# Patient Record
Sex: Male | Born: 1940 | Race: White | Hispanic: No | Smoking: Former smoker
Health system: Southern US, Community
[De-identification: ages and names within clinical notes are randomized; demographics above are authoritative.]

## PROBLEM LIST (undated history)

## (undated) DIAGNOSIS — J45909 Unspecified asthma, uncomplicated: Secondary | ICD-10-CM

## (undated) DIAGNOSIS — M1990 Unspecified osteoarthritis, unspecified site: Secondary | ICD-10-CM

## (undated) DIAGNOSIS — R188 Other ascites: Secondary | ICD-10-CM

## (undated) DIAGNOSIS — I639 Cerebral infarction, unspecified: Secondary | ICD-10-CM

## (undated) DIAGNOSIS — E119 Type 2 diabetes mellitus without complications: Secondary | ICD-10-CM

## (undated) DIAGNOSIS — I10 Essential (primary) hypertension: Secondary | ICD-10-CM

## (undated) DIAGNOSIS — K7460 Unspecified cirrhosis of liver: Secondary | ICD-10-CM

## (undated) DIAGNOSIS — I509 Heart failure, unspecified: Secondary | ICD-10-CM

## (undated) DIAGNOSIS — R569 Unspecified convulsions: Secondary | ICD-10-CM

## (undated) DIAGNOSIS — C801 Malignant (primary) neoplasm, unspecified: Secondary | ICD-10-CM

## (undated) DIAGNOSIS — J449 Chronic obstructive pulmonary disease, unspecified: Secondary | ICD-10-CM

## (undated) DIAGNOSIS — N289 Disorder of kidney and ureter, unspecified: Secondary | ICD-10-CM

## (undated) DIAGNOSIS — I219 Acute myocardial infarction, unspecified: Secondary | ICD-10-CM

## (undated) DIAGNOSIS — I2510 Atherosclerotic heart disease of native coronary artery without angina pectoris: Secondary | ICD-10-CM

## (undated) HISTORY — PX: HERNIA REPAIR: SHX51

## (undated) HISTORY — PX: HEMORRHOID SURGERY: SHX153

## (undated) HISTORY — PX: CORONARY STENT PLACEMENT: SHX1402

---

## 2002-10-31 ENCOUNTER — Emergency Department (HOSPITAL_COMMUNITY): Admission: EM | Admit: 2002-10-31 | Discharge: 2002-10-31 | Payer: Self-pay | Admitting: Emergency Medicine

## 2003-03-31 ENCOUNTER — Emergency Department (HOSPITAL_COMMUNITY): Admission: EM | Admit: 2003-03-31 | Discharge: 2003-03-31 | Payer: Self-pay | Admitting: Emergency Medicine

## 2010-08-14 ENCOUNTER — Emergency Department (HOSPITAL_COMMUNITY)
Admission: EM | Admit: 2010-08-14 | Discharge: 2010-08-14 | Disposition: A | Discharge status: Home / Self Care | Payer: Medicare Other | Attending: Emergency Medicine | Admitting: Emergency Medicine

## 2010-08-14 DIAGNOSIS — F1010 Alcohol abuse, uncomplicated: Secondary | ICD-10-CM | POA: Insufficient documentation

## 2012-08-12 ENCOUNTER — Emergency Department: Payer: Self-pay | Admitting: Unknown Physician Specialty

## 2012-08-12 LAB — CBC
MCHC: 35.3 g/dL (ref 32.0–36.0)
MCV: 87 fL (ref 80–100)
Platelet: 182 10*3/uL (ref 150–440)
RBC: 4.46 10*6/uL (ref 4.40–5.90)
RDW: 14.2 % (ref 11.5–14.5)

## 2012-08-12 LAB — COMPREHENSIVE METABOLIC PANEL
Albumin: 3.9 g/dL (ref 3.4–5.0)
Alkaline Phosphatase: 64 U/L (ref 50–136)
Anion Gap: 8 (ref 7–16)
Bilirubin,Total: 0.3 mg/dL (ref 0.2–1.0)
Co2: 28 mmol/L (ref 21–32)
Creatinine: 0.64 mg/dL (ref 0.60–1.30)
EGFR (African American): >60
EGFR (Non-African Amer.): >60
Glucose: 122 mg/dL — ABNORMAL HIGH (ref 65–99)
Osmolality: 279 (ref 275–301)
Potassium: 3.4 mmol/L — ABNORMAL LOW (ref 3.5–5.1)

## 2012-08-12 LAB — ETHANOL
Ethanol %: 0.221 % — ABNORMAL HIGH (ref 0.000–0.080)
Ethanol: 221 mg/dL

## 2012-08-12 LAB — TSH: Thyroid Stimulating Horm: 0.898 u[IU]/mL

## 2012-08-13 LAB — URINALYSIS, COMPLETE
Blood: NEGATIVE
Glucose,UR: NEGATIVE mg/dL (ref 0–75)
Hyaline Cast: 4
Ketone: NEGATIVE
Leukocyte Esterase: NEGATIVE
Nitrite: NEGATIVE
Ph: 5 (ref 4.5–8.0)
Squamous Epithelial: NONE SEEN
WBC UR: 1 /HPF (ref 0–5)

## 2012-08-13 LAB — DRUG SCREEN, URINE
Amphetamines, Ur Screen: NEGATIVE (ref ?–1000)
Barbiturates, Ur Screen: POSITIVE (ref ?–200)
Cannabinoid 50 Ng, Ur ~~LOC~~: NEGATIVE (ref ?–50)
Methadone, Ur Screen: NEGATIVE (ref ?–300)
Phencyclidine (PCP) Ur S: NEGATIVE (ref ?–25)
Tricyclic, Ur Screen: NEGATIVE (ref ?–1000)

## 2014-05-14 ENCOUNTER — Inpatient Hospital Stay (HOSPITAL_COMMUNITY)
Admission: EM | Admit: 2014-05-14 | Discharge: 2014-05-21 | DRG: 313 | Disposition: A | Discharge status: Home / Self Care | Payer: Medicare Other | Attending: Internal Medicine | Admitting: Internal Medicine

## 2014-05-14 ENCOUNTER — Emergency Department (HOSPITAL_COMMUNITY): Payer: Medicare Other

## 2014-05-14 ENCOUNTER — Encounter (HOSPITAL_COMMUNITY): Payer: Self-pay | Admitting: Emergency Medicine

## 2014-05-14 DIAGNOSIS — E785 Hyperlipidemia, unspecified: Secondary | ICD-10-CM | POA: Diagnosis present

## 2014-05-14 DIAGNOSIS — F1910 Other psychoactive substance abuse, uncomplicated: Secondary | ICD-10-CM | POA: Diagnosis present

## 2014-05-14 DIAGNOSIS — Y905 Blood alcohol level of 100-119 mg/100 ml: Secondary | ICD-10-CM | POA: Diagnosis not present

## 2014-05-14 DIAGNOSIS — M1990 Unspecified osteoarthritis, unspecified site: Secondary | ICD-10-CM | POA: Diagnosis present

## 2014-05-14 DIAGNOSIS — K7290 Hepatic failure, unspecified without coma: Secondary | ICD-10-CM

## 2014-05-14 DIAGNOSIS — F1020 Alcohol dependence, uncomplicated: Secondary | ICD-10-CM | POA: Diagnosis not present

## 2014-05-14 DIAGNOSIS — D649 Anemia, unspecified: Secondary | ICD-10-CM | POA: Diagnosis not present

## 2014-05-14 DIAGNOSIS — J45909 Unspecified asthma, uncomplicated: Secondary | ICD-10-CM | POA: Diagnosis not present

## 2014-05-14 DIAGNOSIS — Z884 Allergy status to anesthetic agent status: Secondary | ICD-10-CM

## 2014-05-14 DIAGNOSIS — I252 Old myocardial infarction: Secondary | ICD-10-CM | POA: Diagnosis not present

## 2014-05-14 DIAGNOSIS — I509 Heart failure, unspecified: Secondary | ICD-10-CM | POA: Diagnosis present

## 2014-05-14 DIAGNOSIS — R0602 Shortness of breath: Secondary | ICD-10-CM

## 2014-05-14 DIAGNOSIS — G4700 Insomnia, unspecified: Secondary | ICD-10-CM | POA: Diagnosis present

## 2014-05-14 DIAGNOSIS — I493 Ventricular premature depolarization: Secondary | ICD-10-CM | POA: Diagnosis not present

## 2014-05-14 DIAGNOSIS — I10 Essential (primary) hypertension: Secondary | ICD-10-CM | POA: Diagnosis not present

## 2014-05-14 DIAGNOSIS — Z9981 Dependence on supplemental oxygen: Secondary | ICD-10-CM

## 2014-05-14 DIAGNOSIS — E876 Hypokalemia: Secondary | ICD-10-CM

## 2014-05-14 DIAGNOSIS — F17210 Nicotine dependence, cigarettes, uncomplicated: Secondary | ICD-10-CM | POA: Diagnosis present

## 2014-05-14 DIAGNOSIS — R109 Unspecified abdominal pain: Secondary | ICD-10-CM

## 2014-05-14 DIAGNOSIS — M79605 Pain in left leg: Secondary | ICD-10-CM | POA: Diagnosis present

## 2014-05-14 DIAGNOSIS — E119 Type 2 diabetes mellitus without complications: Secondary | ICD-10-CM

## 2014-05-14 DIAGNOSIS — Z888 Allergy status to other drugs, medicaments and biological substances status: Secondary | ICD-10-CM | POA: Diagnosis not present

## 2014-05-14 DIAGNOSIS — J441 Chronic obstructive pulmonary disease with (acute) exacerbation: Secondary | ICD-10-CM | POA: Diagnosis not present

## 2014-05-14 DIAGNOSIS — K5900 Constipation, unspecified: Secondary | ICD-10-CM | POA: Diagnosis present

## 2014-05-14 DIAGNOSIS — K7460 Unspecified cirrhosis of liver: Secondary | ICD-10-CM | POA: Diagnosis not present

## 2014-05-14 DIAGNOSIS — F419 Anxiety disorder, unspecified: Secondary | ICD-10-CM | POA: Diagnosis present

## 2014-05-14 DIAGNOSIS — R0789 Other chest pain: Secondary | ICD-10-CM | POA: Diagnosis not present

## 2014-05-14 DIAGNOSIS — F333 Major depressive disorder, recurrent, severe with psychotic symptoms: Secondary | ICD-10-CM | POA: Diagnosis present

## 2014-05-14 DIAGNOSIS — Z590 Homelessness: Secondary | ICD-10-CM

## 2014-05-14 DIAGNOSIS — Z8673 Personal history of transient ischemic attack (TIA), and cerebral infarction without residual deficits: Secondary | ICD-10-CM

## 2014-05-14 DIAGNOSIS — R079 Chest pain, unspecified: Secondary | ICD-10-CM | POA: Diagnosis present

## 2014-05-14 DIAGNOSIS — R0902 Hypoxemia: Secondary | ICD-10-CM | POA: Diagnosis not present

## 2014-05-14 DIAGNOSIS — N289 Disorder of kidney and ureter, unspecified: Secondary | ICD-10-CM | POA: Diagnosis present

## 2014-05-14 DIAGNOSIS — I2510 Atherosclerotic heart disease of native coronary artery without angina pectoris: Secondary | ICD-10-CM | POA: Diagnosis present

## 2014-05-14 DIAGNOSIS — R45851 Suicidal ideations: Secondary | ICD-10-CM | POA: Insufficient documentation

## 2014-05-14 DIAGNOSIS — M545 Low back pain: Secondary | ICD-10-CM | POA: Diagnosis present

## 2014-05-14 DIAGNOSIS — K7682 Hepatic encephalopathy: Secondary | ICD-10-CM

## 2014-05-14 DIAGNOSIS — Z8546 Personal history of malignant neoplasm of prostate: Secondary | ICD-10-CM | POA: Diagnosis not present

## 2014-05-14 DIAGNOSIS — R140 Abdominal distension (gaseous): Secondary | ICD-10-CM

## 2014-05-14 DIAGNOSIS — R072 Precordial pain: Secondary | ICD-10-CM

## 2014-05-14 HISTORY — DX: Disorder of kidney and ureter, unspecified: N28.9

## 2014-05-14 HISTORY — DX: Unspecified osteoarthritis, unspecified site: M19.90

## 2014-05-14 HISTORY — DX: Heart failure, unspecified: I50.9

## 2014-05-14 HISTORY — DX: Essential (primary) hypertension: I10

## 2014-05-14 HISTORY — DX: Unspecified asthma, uncomplicated: J45.909

## 2014-05-14 HISTORY — DX: Chronic obstructive pulmonary disease, unspecified: J44.9

## 2014-05-14 HISTORY — DX: Atherosclerotic heart disease of native coronary artery without angina pectoris: I25.10

## 2014-05-14 HISTORY — DX: Malignant (primary) neoplasm, unspecified: C80.1

## 2014-05-14 HISTORY — DX: Type 2 diabetes mellitus without complications: E11.9

## 2014-05-14 HISTORY — DX: Cerebral infarction, unspecified: I63.9

## 2014-05-14 HISTORY — DX: Unspecified cirrhosis of liver: K74.60

## 2014-05-14 HISTORY — DX: Unspecified convulsions: R56.9

## 2014-05-14 LAB — CBC WITH DIFFERENTIAL/PLATELET
Basophils Absolute: 0 10*3/uL (ref 0.0–0.1)
Basophils Relative: 0 % (ref 0–1)
EOS ABS: 0.1 10*3/uL (ref 0.0–0.7)
Eosinophils Relative: 2 % (ref 0–5)
HCT: 32.5 % — ABNORMAL LOW (ref 39.0–52.0)
HEMOGLOBIN: 10.8 g/dL — AB (ref 13.0–17.0)
Lymphocytes Relative: 12 % (ref 12–46)
Lymphs Abs: 0.7 10*3/uL (ref 0.7–4.0)
MCH: 27.8 pg (ref 26.0–34.0)
MCHC: 33.2 g/dL (ref 30.0–36.0)
MCV: 83.5 fL (ref 78.0–100.0)
MONOS PCT: 5 % (ref 3–12)
Monocytes Absolute: 0.3 10*3/uL (ref 0.1–1.0)
Neutro Abs: 5 10*3/uL (ref 1.7–7.7)
Neutrophils Relative %: 81 % — ABNORMAL HIGH (ref 43–77)
Platelets: 158 10*3/uL (ref 150–400)
RBC: 3.89 MIL/uL — AB (ref 4.22–5.81)
RDW: 14.4 % (ref 11.5–15.5)
WBC: 6.1 10*3/uL (ref 4.0–10.5)

## 2014-05-14 LAB — RAPID URINE DRUG SCREEN, HOSP PERFORMED
Amphetamines: NOT DETECTED
BARBITURATES: NOT DETECTED
Benzodiazepines: POSITIVE — AB
COCAINE: NOT DETECTED
OPIATES: NOT DETECTED
TETRAHYDROCANNABINOL: NOT DETECTED

## 2014-05-14 LAB — URINALYSIS, ROUTINE W REFLEX MICROSCOPIC
Bilirubin Urine: NEGATIVE
Glucose, UA: 100 mg/dL — AB
Ketones, ur: NEGATIVE mg/dL
LEUKOCYTES UA: NEGATIVE
Nitrite: NEGATIVE
PH: 6 (ref 5.0–8.0)
PROTEIN: NEGATIVE mg/dL
Specific Gravity, Urine: 1.009 (ref 1.005–1.030)
Urobilinogen, UA: 1 mg/dL (ref 0.0–1.0)

## 2014-05-14 LAB — COMPREHENSIVE METABOLIC PANEL
ALK PHOS: 84 U/L (ref 39–117)
ALT: 27 U/L (ref 0–53)
ANION GAP: 13 (ref 5–15)
AST: 26 U/L (ref 0–37)
Albumin: 3 g/dL — ABNORMAL LOW (ref 3.5–5.2)
BUN: <5 mg/dL — ABNORMAL LOW (ref 6–23)
CO2: 24 mmol/L (ref 19–32)
CREATININE: 0.71 mg/dL (ref 0.50–1.35)
Calcium: 7.5 mg/dL — ABNORMAL LOW (ref 8.4–10.5)
Chloride: 98 mmol/L (ref 96–112)
Glucose, Bld: 184 mg/dL — ABNORMAL HIGH (ref 70–99)
Potassium: 2.7 mmol/L — CL (ref 3.5–5.1)
Sodium: 135 mmol/L (ref 135–145)
TOTAL PROTEIN: 5.4 g/dL — AB (ref 6.0–8.3)
Total Bilirubin: 0.3 mg/dL (ref 0.3–1.2)

## 2014-05-14 LAB — URINE MICROSCOPIC-ADD ON

## 2014-05-14 LAB — PROTIME-INR
INR: 1.07 (ref 0.00–1.49)
Prothrombin Time: 14.1 seconds (ref 11.6–15.2)

## 2014-05-14 LAB — AMMONIA: Ammonia: 68 umol/L — ABNORMAL HIGH (ref 11–32)

## 2014-05-14 LAB — I-STAT TROPONIN, ED: TROPONIN I, POC: 0 ng/mL (ref 0.00–0.08)

## 2014-05-14 LAB — MRSA PCR SCREENING: MRSA BY PCR: NEGATIVE

## 2014-05-14 LAB — ETHANOL: ALCOHOL ETHYL (B): 112 mg/dL — AB (ref 0–9)

## 2014-05-14 LAB — ACETAMINOPHEN LEVEL

## 2014-05-14 LAB — SALICYLATE LEVEL: Salicylate Lvl: <4 mg/dL (ref 2.8–20.0)

## 2014-05-14 MED ORDER — LORAZEPAM 2 MG/ML IJ SOLN
1.0000 mg | Freq: Four times a day (QID) | INTRAMUSCULAR | Status: AC | PRN
  Administered 2014-05-14 – 2014-05-16 (×4): 1 mg via INTRAVENOUS
  Filled 2014-05-14 (×5): qty 1

## 2014-05-14 MED ORDER — GI COCKTAIL ~~LOC~~
30.0000 mL | Freq: Once | ORAL | Status: AC
  Administered 2014-05-14: 30 mL via ORAL
  Filled 2014-05-14: qty 30

## 2014-05-14 MED ORDER — LACTULOSE 10 GM/15ML PO SOLN
10.0000 g | Freq: Once | ORAL | Status: AC
  Administered 2014-05-14: 10 g via ORAL
  Filled 2014-05-14: qty 15

## 2014-05-14 MED ORDER — ENOXAPARIN SODIUM 40 MG/0.4ML ~~LOC~~ SOLN
40.0000 mg | SUBCUTANEOUS | Status: DC
  Administered 2014-05-14: 40 mg via SUBCUTANEOUS
  Filled 2014-05-14 (×6): qty 0.4

## 2014-05-14 MED ORDER — MORPHINE SULFATE 4 MG/ML IJ SOLN
4.0000 mg | Freq: Once | INTRAMUSCULAR | Status: AC
Start: 2014-05-14 — End: 2014-05-14
  Administered 2014-05-14: 4 mg via INTRAVENOUS
  Filled 2014-05-14: qty 1

## 2014-05-14 MED ORDER — POTASSIUM CHLORIDE CRYS ER 20 MEQ PO TBCR
40.0000 meq | EXTENDED_RELEASE_TABLET | Freq: Once | ORAL | Status: AC
  Administered 2014-05-14: 40 meq via ORAL
  Filled 2014-05-14: qty 2

## 2014-05-14 MED ORDER — KETOROLAC TROMETHAMINE 30 MG/ML IJ SOLN
30.0000 mg | Freq: Once | INTRAMUSCULAR | Status: AC
  Administered 2014-05-14: 30 mg via INTRAVENOUS
  Filled 2014-05-14: qty 1

## 2014-05-14 MED ORDER — IOHEXOL 350 MG/ML SOLN
100.0000 mL | Freq: Once | INTRAVENOUS | Status: AC | PRN
  Administered 2014-05-14: 100 mL via INTRAVENOUS

## 2014-05-14 MED ORDER — SODIUM CHLORIDE 0.9 % IV SOLN
INTRAVENOUS | Status: DC
  Administered 2014-05-14 – 2014-05-15 (×2): via INTRAVENOUS

## 2014-05-14 MED ORDER — IPRATROPIUM-ALBUTEROL 0.5-2.5 (3) MG/3ML IN SOLN: 3.0000 mL | Freq: Four times a day (QID) | RESPIRATORY_TRACT | Status: DC | PRN

## 2014-05-14 MED ORDER — IOHEXOL 300 MG/ML  SOLN: 20.0000 mL | INTRAMUSCULAR | Status: DC

## 2014-05-14 MED ORDER — SODIUM CHLORIDE 0.9 % IV SOLN
Freq: Once | INTRAVENOUS | Status: AC
  Administered 2014-05-14: 14:00:00 via INTRAVENOUS

## 2014-05-14 MED ORDER — LEVOFLOXACIN 750 MG PO TABS
750.0000 mg | ORAL_TABLET | Freq: Every day | ORAL | Status: DC
  Administered 2014-05-14 – 2014-05-18 (×5): 750 mg via ORAL
  Filled 2014-05-14 (×6): qty 1

## 2014-05-14 MED ORDER — SODIUM CHLORIDE 0.9 % IV BOLUS (SEPSIS)
1000.0000 mL | Freq: Once | INTRAVENOUS | Status: AC
  Administered 2014-05-14: 1000 mL via INTRAVENOUS

## 2014-05-14 MED ORDER — THIAMINE HCL 100 MG/ML IJ SOLN
100.0000 mg | Freq: Every day | INTRAMUSCULAR | Status: DC
  Filled 2014-05-14 (×8): qty 1

## 2014-05-14 MED ORDER — LACTULOSE 10 GM/15ML PO SOLN
10.0000 g | Freq: Every day | ORAL | Status: DC
  Administered 2014-05-14 – 2014-05-20 (×6): 10 g via ORAL
  Filled 2014-05-14 (×8): qty 15

## 2014-05-14 MED ORDER — FOLIC ACID 1 MG PO TABS
1.0000 mg | ORAL_TABLET | Freq: Every day | ORAL | Status: DC
  Administered 2014-05-14 – 2014-05-21 (×8): 1 mg via ORAL
  Filled 2014-05-14 (×8): qty 1

## 2014-05-14 MED ORDER — IPRATROPIUM-ALBUTEROL 0.5-2.5 (3) MG/3ML IN SOLN
3.0000 mL | Freq: Once | RESPIRATORY_TRACT | Status: AC
  Administered 2014-05-14: 3 mL via RESPIRATORY_TRACT
  Filled 2014-05-14: qty 3

## 2014-05-14 MED ORDER — POTASSIUM CHLORIDE 10 MEQ/100ML IV SOLN
10.0000 meq | INTRAVENOUS | Status: DC
  Administered 2014-05-14 (×3): 10 meq via INTRAVENOUS
  Filled 2014-05-14 (×3): qty 100

## 2014-05-14 MED ORDER — VITAMIN B-1 100 MG PO TABS
100.0000 mg | ORAL_TABLET | Freq: Every day | ORAL | Status: DC
  Administered 2014-05-14 – 2014-05-21 (×8): 100 mg via ORAL
  Filled 2014-05-14 (×8): qty 1

## 2014-05-14 MED ORDER — LORAZEPAM 1 MG PO TABS
1.0000 mg | ORAL_TABLET | Freq: Four times a day (QID) | ORAL | Status: AC | PRN
  Filled 2014-05-14: qty 1

## 2014-05-14 MED ORDER — SODIUM CHLORIDE 0.9 % IJ SOLN
3.0000 mL | Freq: Two times a day (BID) | INTRAMUSCULAR | Status: DC
  Administered 2014-05-14 – 2014-05-20 (×11): 3 mL via INTRAVENOUS

## 2014-05-14 MED ORDER — PANTOPRAZOLE SODIUM 40 MG IV SOLR
40.0000 mg | Freq: Two times a day (BID) | INTRAVENOUS | Status: DC
  Administered 2014-05-14 – 2014-05-17 (×7): 40 mg via INTRAVENOUS
  Filled 2014-05-14 (×12): qty 40

## 2014-05-14 MED ORDER — PREDNISONE 20 MG PO TABS
40.0000 mg | ORAL_TABLET | Freq: Every day | ORAL | Status: DC
  Administered 2014-05-14 – 2014-05-19 (×5): 40 mg via ORAL
  Filled 2014-05-14 (×6): qty 2

## 2014-05-14 MED ORDER — ADULT MULTIVITAMIN W/MINERALS CH
1.0000 | ORAL_TABLET | Freq: Every day | ORAL | Status: DC
  Administered 2014-05-14 – 2014-05-21 (×8): 1 via ORAL
  Filled 2014-05-14 (×9): qty 1

## 2014-05-14 MED ORDER — IPRATROPIUM-ALBUTEROL 0.5-2.5 (3) MG/3ML IN SOLN
3.0000 mL | Freq: Four times a day (QID) | RESPIRATORY_TRACT | Status: DC
  Administered 2014-05-14: 3 mL via RESPIRATORY_TRACT
  Filled 2014-05-14: qty 3

## 2014-05-14 MED ORDER — ONDANSETRON HCL 4 MG/2ML IJ SOLN
4.0000 mg | Freq: Once | INTRAMUSCULAR | Status: AC
  Administered 2014-05-14: 4 mg via INTRAVENOUS
  Filled 2014-05-14: qty 2

## 2014-05-14 MED ORDER — BUDESONIDE-FORMOTEROL FUMARATE 160-4.5 MCG/ACT IN AERO
2.0000 | INHALATION_SPRAY | Freq: Two times a day (BID) | RESPIRATORY_TRACT | Status: DC
  Administered 2014-05-17 – 2014-05-21 (×9): 2 via RESPIRATORY_TRACT
  Filled 2014-05-14: qty 6

## 2014-05-14 MED ORDER — ONDANSETRON HCL 4 MG/2ML IJ SOLN: 4.0000 mg | Freq: Three times a day (TID) | INTRAMUSCULAR | Status: AC | PRN

## 2014-05-14 NOTE — ED Notes (Signed)
Homeless. EMS picked up from a motel where he stayed last night. Reports chest pain. Has been stating since EMS arrival that he wants to die and could care less if he dies right now. States this numerous times. Very defensive answering all questions. Deep rattling cough heard, states he has had that for years. Edema of legs, hands, and large ascites in abdomen. States it is increased. States the CP is substernal and started 3 hours ago. Refused asprin in route, states he is allergic to it. Reports when he had a place to stay, he used home 02, 5L [place]. Currently 90% on room air.

## 2014-05-14 NOTE — ED Notes (Signed)
Admitting team MD at bedside.

## 2014-05-14 NOTE — ED Notes (Signed)
Attempted report 

## 2014-05-14 NOTE — ED Notes (Signed)
Notified CT patient finished with PO contrast.

## 2014-05-14 NOTE — ED Notes (Signed)
Mitchell Ratel, MD of critical K 2.7.

## 2014-05-14 NOTE — ED Notes (Signed)
Patient transported to X-ray 

## 2014-05-14 NOTE — H&P (Signed)
Date: 05/14/2014               Patient Name:  Mitchell Molina MRN: [id number]  DOB: [birth year] Age / Sex: 74 y.o., male   PCP: No primary care provider on file.         Medical Service: Internal Medicine Teaching Service         Attending Physician: Dr. Axel Filler, MD    First Contact: Dr. Charlott Rakes Pager: [phone]  Second Contact: Dr. Bing Neighbors Pager: [phone]       After Hours (After 5p/  First Contact Pager: [phone]  weekends / holidays): Second Contact Pager: [phone]   Chief Complaint: chest pain, worsening cough  History of Present Illness: Mitchell Molina is a 74 year old male with no prior medical history who presents to the ED with chest pain. At the time of interview, his blood alcohol level was 112.  This morning around 8-9AM, he reports drinking beer with his friends at a motel when we first noted the onset of intermittent chest pain. The pain started at the R lower chest and extended up to the mid-sternal area. He reports the pain improves with lying supine but worsens with deep inspiration and coughing. He also reports having two heart attacks about 5-6 years ago in [place], [place] which improved with the medications he received in the hospital.   He also reports a cough productive with "brown stuff " that has been ongoing for years though has worsened over the last several days. He has been on home oxygen of 2-3L in the past though not recently and was told by a doctor that he had bronchitis/emphysema. He has a 30-pack year smoking history and reports that a 12-pack of beer lasts him a couple days and has so for the last 15-20 years. He has considered the need to cut down on drinking, felt guilty about drinking, and required a drink in the morning to get his day going. Of note, he expresses that he has thoughts of wanting to hurt himself and others and would do so if he had access to a firearm though currently does not. He denies other illicit and IV drug use.   In the  ED, he was found to have K 2.7 and NH4 68. CT chest/abdomen was unremarkable for an acute process, and he was given Duonebs and lactulose.    Meds: Current Facility-Administered Medications  Medication Dose Route Frequency Provider Last Rate Last Dose  . lactulose (CHRONULAC) 10 GM/15ML solution 10 g  10 g Oral Once Wandra Arthurs, MD       No current outpatient prescriptions on file.    Allergies: Allergies as of 05/14/2014 - Review Complete 05/14/2014  Allergen Reaction Noted  . Asa [aspirin] Other (See Comments) 05/14/2014  . Tylenol [acetaminophen] Other (See Comments) 05/14/2014   Past Medical History  Diagnosis Date  . COPD (chronic obstructive pulmonary disease)   . Diabetes mellitus without complication   . Hypertension   . Coronary artery disease   . Arthritis   . Asthma   . Cancer   . CHF (congestive heart failure)   . Seizures   . Stroke   . Renal disorder   . Cirrhosis    Past Surgical History  Procedure Laterality Date  . Hernia repair    . Hemorrhoid surgery     History reviewed. No pertinent family history. History   Social History  . Marital Status: Legally Separated    Spouse  Name: N/A    Number of Children: N/A  . Years of Education: N/A   Occupational History  . Not on file.   Social History Main Topics  . Smoking status: Current Some Day Smoker  . Smokeless tobacco: Not on file  . Alcohol Use: Yes     Comment: daily; liquor or wine; last drink yesterday  . Drug Use: No  . Sexual Activity: Not on file   Other Topics Concern  . Not on file   Social History Narrative  . No narrative on file    Review of Systems: Review of Systems  Constitutional: Positive for fever and malaise/fatigue. Negative for chills.  Respiratory: Positive for cough, sputum production and shortness of breath.   Cardiovascular: Positive for chest pain and leg swelling.  Gastrointestinal: Positive for nausea, vomiting, diarrhea, constipation and blood in stool.        Intermittent blood in his stool  Genitourinary: Positive for dysuria. Negative for hematuria.       Intermittent  Musculoskeletal: Negative for falls.  Neurological: Positive for weakness and headaches.  Psychiatric/Behavioral: Positive for depression, suicidal ideas, hallucinations and substance abuse. The patient is nervous/anxious.      Physical Exam: Blood pressure 135/77, pulse 94, temperature 98.6 F (37 C), temperature source Oral, resp. rate 20, height 5\' 10"  (1.778 m), weight 200 lb (90.719 kg), SpO2 97 %.  General: resting in bed, NAD, 2L O2 by [place] HEENT: PERRL, EOMI, no scleral icterus, dry mucous membranes, oropharynx clear Cardiac: RRR, no rubs, murmurs or gallops, pain with palpation over the mid-sternum Pulm: wheezing present in all fields Abd: soft, distended with mild tenderness all over on palpation, BS present Ext: warm and well perfused, no pedal edema Neuro: L forehead with different sensation compared to R, rest of the exam deferred as he was not agreeable to it   Lab results: Basic Metabolic Panel:  Recent Labs  05/14/14 1142  NA 135  K 2.7*  CL 98  CO2 24  GLUCOSE 184*  BUN <5*  CREATININE 0.71  CALCIUM 7.5*   Liver Function Tests:  Recent Labs  05/14/14 1142  AST 26  ALT 27  ALKPHOS 84  BILITOT 0.3  PROT 5.4*  ALBUMIN 3.0*    Recent Labs  05/14/14 1142  AMMONIA 68*   CBC:  Recent Labs  05/14/14 1142  WBC 6.1  NEUTROABS 5.0  HGB 10.8*  HCT 32.5*  MCV 83.5  PLT 158   Coagulation:  Recent Labs  05/14/14 1142  LABPROT 14.1  INR 1.07   Urine Drug Screen: Drugs of Abuse     Component Value Date/Time   LABOPIA NONE DETECTED 05/14/2014 1206   COCAINSCRNUR NONE DETECTED 05/14/2014 1206   LABBENZ POSITIVE* 05/14/2014 1206   AMPHETMU NONE DETECTED 05/14/2014 1206   THCU NONE DETECTED 05/14/2014 1206   LABBARB NONE DETECTED 05/14/2014 1206    Alcohol Level:  Recent Labs  05/14/14 1142  ETH 112*    Urinalysis:  Recent Labs  05/14/14 1206  COLORURINE YELLOW  LABSPEC 1.009  PHURINE 6.0  GLUCOSEU 100*  HGBUR SMALL*  BILIRUBINUR NEGATIVE  KETONESUR NEGATIVE  PROTEINUR NEGATIVE  UROBILINOGEN 1.0  NITRITE NEGATIVE  LEUKOCYTESUR NEGATIVE    Imaging results:  Ct Abdomen Pelvis Wo Contrast  05/14/2014   CLINICAL DATA:  Chest and mid abdominal pain. History of multiple medical problems including chronic obstructive pulmonary disease, hypertension, diabetes and coronary artery disease. Concern for pulmonary embolism.  EXAM: CT CHEST, ABDOMEN AND PELVIS  WITHOUT CONTRAST  TECHNIQUE: Multidetector CT imaging of the chest, abdomen and pelvis was performed following the standard protocol without IV contrast.  COMPARISON:  None.  FINDINGS: CT CHEST FINDINGS  Study was ordered and initially attempted as a CTA. However, there was contrast extravasation from the IV in the right upper arm. 76 ml of Omnipaque 350 was infiltrated into the right upper arm soft tissues. I examined the patient and demonstrated no skin or pulse changes. Cold compresses were applied. A follow-up scout image of the right upper arm demonstrates the contrast dispersed throughout the medial soft tissues of the right upper arm. Contrast extravasation orders will be placed in the patient's chart. Noncontrast examinations of the chest, abdomen and pelvis for subsequently requested.  Mediastinum: There are no enlarged mediastinal or hilar lymph nodes. The thyroid gland and trachea demonstrate no significant findings. There is a small hiatal hernia. The heart size is normal. There is trace pericardial fluid versus thickening.Coronary artery calcifications are noted. There is mild atherosclerosis of the aorta and great vessels.  Lungs/Pleura: There is no pleural effusion.The lungs demonstrate moderate centrilobular and paraseptal emphysema. There is an irregular density within the lingula on axial images 30 through 40. This has a linear  configuration on the reformatted images and is most consistent with scarring. There is no confluent airspace opacity or suspicious pulmonary nodule.  Musculoskeletal/Chest wall: Bilateral gynecomastia noted. There are no worrisome osseous findings. There are bone islands and old rib fractures on the left.  CT ABDOMEN AND PELVIS FINDINGS  Hepatobiliary: As evaluated in the noncontrast state, the liver demonstrates no focal abnormality. No evidence of gallstones, gallbladder wall thickening or biliary dilatation.  Pancreas: Unremarkable. No pancreatic ductal dilatation or surrounding inflammatory changes.  Spleen: Normal in size without focal abnormality.  Adrenals/Urinary Tract: Both adrenal glands appear normal.There is a small amount of contrast material within the renal collecting systems bilaterally. There is no evidence of urinary tract calculus or hydronephrosis. There is no evidence of renal mass. A small amount of air is present within the urinary bladder which otherwise appears unremarkable.  Stomach/Bowel: No evidence of bowel wall thickening, distention or surrounding inflammatory change.The appendix appears normal. There are diverticular changes of the sigmoid colon. No extraluminal fluid collection or ascites demonstrated.  Vascular/Lymphatic: There are no enlarged abdominal or pelvic lymph nodes. Mild aortoiliac atherosclerosis noted.  Reproductive: The prostate gland and seminal vesicles demonstrate no significant findings.  Other: No evidence of abdominal wall mass or hernia.  Musculoskeletal: No acute or significant osseous findings.  IMPRESSION: 1. No acute findings demonstrated within the chest, abdomen or pelvis on noncontrast imaging. 2. Attempted contrast-enhanced study aborted secondary to contrast extravasation into the right upper arm. Post extravasation orders are to be placed in the patient's chart. Elevation of the right arm, cold compresses and close clinical observation recommended. 3.  Probable scarring in the lingula. Although not morphologically very concerning for malignancy, given presumed risk factors for lung cancer, chest CT follow-up in 6 months suggested. 4. Atherosclerosis as described.   Electronically Signed   By: Camie Patience M.D.   On: 05/14/2014 15:58   Dg Chest 2 View  05/14/2014   CLINICAL DATA:  Cough and chest pain  EXAM: CHEST  2 VIEW  COMPARISON:  None.  FINDINGS: Cardiac shadow is within normal limits. The lungs are clear bilaterally. Old rib fractures with healing are noted on the right. No acute abnormality is seen.  IMPRESSION: No active cardiopulmonary disease.   Electronically Signed  By: Inez Catalina M.D.   On: 05/14/2014 12:24   Ct Chest Wo Contrast  05/14/2014   CLINICAL DATA:  Chest and mid abdominal pain. History of multiple medical problems including chronic obstructive pulmonary disease, hypertension, diabetes and coronary artery disease. Concern for pulmonary embolism.  EXAM: CT CHEST, ABDOMEN AND PELVIS WITHOUT CONTRAST  TECHNIQUE: Multidetector CT imaging of the chest, abdomen and pelvis was performed following the standard protocol without IV contrast.  COMPARISON:  None.  FINDINGS: CT CHEST FINDINGS  Study was ordered and initially attempted as a CTA. However, there was contrast extravasation from the IV in the right upper arm. 76 ml of Omnipaque 350 was infiltrated into the right upper arm soft tissues. I examined the patient and demonstrated no skin or pulse changes. Cold compresses were applied. A follow-up scout image of the right upper arm demonstrates the contrast dispersed throughout the medial soft tissues of the right upper arm. Contrast extravasation orders will be placed in the patient's chart. Noncontrast examinations of the chest, abdomen and pelvis for subsequently requested.  Mediastinum: There are no enlarged mediastinal or hilar lymph nodes. The thyroid gland and trachea demonstrate no significant findings. There is a small hiatal  hernia. The heart size is normal. There is trace pericardial fluid versus thickening.Coronary artery calcifications are noted. There is mild atherosclerosis of the aorta and great vessels.  Lungs/Pleura: There is no pleural effusion.The lungs demonstrate moderate centrilobular and paraseptal emphysema. There is an irregular density within the lingula on axial images 30 through 40. This has a linear configuration on the reformatted images and is most consistent with scarring. There is no confluent airspace opacity or suspicious pulmonary nodule.  Musculoskeletal/Chest wall: Bilateral gynecomastia noted. There are no worrisome osseous findings. There are bone islands and old rib fractures on the left.  CT ABDOMEN AND PELVIS FINDINGS  Hepatobiliary: As evaluated in the noncontrast state, the liver demonstrates no focal abnormality. No evidence of gallstones, gallbladder wall thickening or biliary dilatation.  Pancreas: Unremarkable. No pancreatic ductal dilatation or surrounding inflammatory changes.  Spleen: Normal in size without focal abnormality.  Adrenals/Urinary Tract: Both adrenal glands appear normal.There is a small amount of contrast material within the renal collecting systems bilaterally. There is no evidence of urinary tract calculus or hydronephrosis. There is no evidence of renal mass. A small amount of air is present within the urinary bladder which otherwise appears unremarkable.  Stomach/Bowel: No evidence of bowel wall thickening, distention or surrounding inflammatory change.The appendix appears normal. There are diverticular changes of the sigmoid colon. No extraluminal fluid collection or ascites demonstrated.  Vascular/Lymphatic: There are no enlarged abdominal or pelvic lymph nodes. Mild aortoiliac atherosclerosis noted.  Reproductive: The prostate gland and seminal vesicles demonstrate no significant findings.  Other: No evidence of abdominal wall mass or hernia.  Musculoskeletal: No acute or  significant osseous findings.  IMPRESSION: 1. No acute findings demonstrated within the chest, abdomen or pelvis on noncontrast imaging. 2. Attempted contrast-enhanced study aborted secondary to contrast extravasation into the right upper arm. Post extravasation orders are to be placed in the patient's chart. Elevation of the right arm, cold compresses and close clinical observation recommended. 3. Probable scarring in the lingula. Although not morphologically very concerning for malignancy, given presumed risk factors for lung cancer, chest CT follow-up in 6 months suggested. 4. Atherosclerosis as described.   Electronically Signed   By: Camie Patience M.D.   On: 05/14/2014 15:58    Other results: EKG: Reviewed and compared with  none prior. Sinus tachycardia: HR 101 Right axis deviation  Assessment & Plan by Problem: Chest pain: Reproducibility of his chest pain is suggestive of atypical nature though is also pleuritic which is suggestive of a pulmonary process, like pneumonia, acute bronchitis, or acute COPD exacerbation; chest CT however did not show an acute process. HEART score 3 [slightly suspicious history, age>65, 1-2 risk factors given cigarette smoking & obesity though unsure about HTN, HLD, DM2] which is low-risk for ACS; TIMI score 1 which is low-risk for ACS. EKG also not suggestive of ACS. He does report GI symptoms which is suggestive of alcoholic gastritis in the setting of his EtOH abuse; abdominal CT otherwise unremarkable for an acute process.  -Monitor on telemetry -Check troponins x 3 -Repeat EKG in AM -Continue nitroglycerin sublingual prn for chest pain -Check A1c -Give Protonix 40mg  IV -Give Zofran 4mg  q8h prn for nausea/vomiting  Elevated ammonia: LFTs were unremarkable on admission though his abdominal exam is concerning for ascites. He reports having cirrhosis though not noted on abdominal CT. Asterixis could not be assessed as he was not agreeable with the complete  physical exam though no spider angioma or other stigmata were appreciated of end-stage liver disease. Mental status assessment limited by his current alcohol intoxication.  -Give lactulose 10g  -Recheck BMET tomorrow -Reassess mental status   Presumed COPD exacerbation: No PFTs on file though smoking history, symptoms, and prior home oxygen therapy are suggestive of chronic disease, especially given his productive cough. Wheezing noted on exam all throughout lung fields. -Continue Duonebs q6h -Give Symbicort 2 puffs BID -Give Levaquin 750mg  -Give prednisone 40mg   Hypokalemia: K 2.7 on admission. He received Kdur 42mEq in the ED. EKG without changes. Possibly 2/2 EtOH gastritis with subsequent vomiting. Anion gap of 13 may also be contributory though bicarb 24.  -BMET as noted above  Polysubstance abuse: CAGE 2/4 on admission is suggestive of EtOH abuse in conjunction with tobacco abuse. UDS+ for benzos which he has received in October 2015 from two providers affiliated with [facility]: Dr. Darlyn Read [Psychiatry] & Dr. [facility]]. Corrected calcium 8.3 though albumin low at 3.0.  -CIWA -Give folate, vitamin B1, vitamin B12, multivitamin -Check Mg/P -Order HIV  Anemia: Likely 2/2 poor nutrition from chronic alcoholism. Hb 10 on admission. Abdominal CT not suggestive of malignancy. UA without hematuria. -Check anemia panel  Homicidal & suicidal ideation: Unsure if these thought processes are organic or in the setting of his current alcohol intoxication. He did report depressed mood and not wanting to live multiple times throughout the interview.  -Continue suicide precautions -Order sitter in the room -Will need IVC if he goes AMA -Consult psychiatry once medically stable  Possible prostate cancer: He reports being told he had prostate cancer by another provider. Abdominal CT without any associated findings.  -Check PSA  #FEN:  -Diet: Regular -NS @  125cc/hour  #DVT prophylaxis: Lovenox  #CODE STATUS: FULL CODE -Cannot confirm given his suicidal ideation   Dispo: Disposition is deferred at this time, awaiting improvement of current medical problems.   The patient does have a current PCP (No primary care provider on file.) and does not know need an [facility] hospital follow-up appointment after discharge.  The patient does not know have transportation limitations that hinder transportation to clinic appointments.  Signed: Charlott Rakes, MD 05/14/2014, 4:44 PM

## 2014-05-14 NOTE — Progress Notes (Signed)
Patient arrived on unit from ED, vital signs stable.  Room prepared according to suicide precaution protocol.  Sitter at bedside.  Patient's belongings stored in three belongings bags at [facility].  Patient voiced agreement with this plan.  Patient placed on telemetry, [place] notified.  Will continue to monitor.

## 2014-05-14 NOTE — ED Notes (Signed)
Patient transported to CT 

## 2014-05-14 NOTE — ED Notes (Signed)
Arrived by EMS saturated with urine; foul body odor.  Removed clothing and gave a complete bath. Dressed in wine paper scrubs.

## 2014-05-14 NOTE — ED Provider Notes (Addendum)
CSN: [id number]     Arrival date & time 05/14/14  1106 History   First MD Initiated Contact with Patient 05/14/14 1109     Chief Complaint  Patient presents with  . Chest Pain  . Suicidal     (Consider location/radiation/quality/duration/timing/severity/associated sxs/prior Treatment) The history is provided by the patient.  Mitchell Molina is a 74 y.o. male alcoholic, cirrhosis, COPD here with chest pain, suicidal ideation, abdominal pain, vomiting. He drinks chronically and last drink was yesterday. He is usually homeless but was in a motel last night. This morning he began to have some substernal chest pain with no radiation. Associated shortness of breath as well. He states that he has COPD but still smokes and supposed to be on home oxygen but hasn't been using it. Also had some thoughts of harming himself and "just ending it all". Moreover he has worsening abdominal pain and abdominal swelling. 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 He hasn't been seeing a doctor for years. Also states that he has prostate cancer but is not currently getting treated.    Past Medical History  Diagnosis Date  . COPD (chronic obstructive pulmonary disease)   . Diabetes mellitus without complication   . Hypertension   . Coronary artery disease   . Arthritis   . Asthma   . Cancer   . CHF (congestive heart failure)   . Seizures   . Stroke   . Renal disorder   . Cirrhosis    Past Surgical History  Procedure Laterality Date  . Hernia repair    . Hemorrhoid surgery     History reviewed. No pertinent family history. History  Substance Use Topics  . Smoking status: Current Some Day Smoker  . Smokeless tobacco: Not on file  . Alcohol Use: Yes     Comment: daily; liquor or wine; last drink yesterday    Review of Systems  Respiratory: Positive for shortness of breath.   Cardiovascular: Positive for chest pain.  Gastrointestinal: Positive for vomiting and  abdominal pain.      Allergies  Asa  Home Medications   Prior to Admission medications   Not on File   BP 135/77 mmHg  Pulse 94  Temp(Src) 98.6 F (37 C) (Oral)  Resp 20  Ht 5\' 10"  (1.778 m)  Wt 200 lb (90.719 kg)  BMI 28.70 kg/m2  SpO2 97% Physical Exam  Constitutional: He is oriented to person, place, and time.  Chronically ill, disheveled   HENT:  Head: Normocephalic.  MM dry   Eyes: Conjunctivae are normal. Pupils are equal, round, and reactive to light.  Neck: Normal range of motion. Neck supple.  Cardiovascular: Regular rhythm and normal heart sounds.   Slightly tachy   Pulmonary/Chest:  Slightly tachypneic, + diffuse wheezing. Mild retractions   Abdominal:  + fluid wave. Mild epigastric tenderness, no rebound   Musculoskeletal: Normal range of motion.  1+ edema   Neurological: He is alert and oriented to person, place, and time.  Skin: Skin is warm and dry.  Psychiatric: He has a normal mood and affect. His behavior is normal. Judgment and thought content normal.  Nursing note and vitals reviewed.   ED Course  Procedures (including critical care time)  Angiocath insertion Performed by: Darl Householder, Nils Thor  Consent: Verbal consent obtained. Risks and benefits: risks, benefits and alternatives were discussed Time out: Immediately prior to procedure a "time out" was called to verify the correct patient, procedure,  equipment, support staff and site/side marked as required.  Preparation: Patient was prepped and draped in the usual sterile fashion.  Vein Location: R upper arm  Ultrasound Guided  Gauge: 20 long  Normal blood return and flush without difficulty Patient tolerance: Patient tolerated the procedure well with no immediate complications.  Angiocath insertion Performed by: Shirlyn Goltz  Consent: Verbal consent obtained. Risks and benefits: risks, benefits and alternatives were discussed Time out: Immediately prior to procedure a "time out" was  called to verify the correct patient, procedure, equipment, support staff and site/side marked as required.  Preparation: Patient was prepped and draped in the usual sterile fashion.  Vein Location: L upper arm  Ultrasound Guided  Gauge: 20 long   Normal blood return and flush without difficulty Patient tolerance: Patient tolerated the procedure well with no immediate complications.      Labs Review Labs Reviewed  CBC WITH DIFFERENTIAL/PLATELET - Abnormal; Notable for the following:    RBC 3.89 (*)    Hemoglobin 10.8 (*)    HCT 32.5 (*)    Neutrophils Relative % 81 (*)    All other components within normal limits  COMPREHENSIVE METABOLIC PANEL - Abnormal; Notable for the following:    Potassium 2.7 (*)    Glucose, Bld 184 (*)    BUN <5 (*)    Calcium 7.5 (*)    Total Protein 5.4 (*)    Albumin 3.0 (*)    All other components within normal limits  ETHANOL - Abnormal; Notable for the following:    Alcohol, Ethyl (B) 112 (*)    All other components within normal limits  ACETAMINOPHEN LEVEL - Abnormal; Notable for the following:    Acetaminophen (Tylenol), Serum <10.0 (*)    All other components within normal limits  AMMONIA - Abnormal; Notable for the following:    Ammonia 68 (*)    All other components within normal limits  URINALYSIS, ROUTINE W REFLEX MICROSCOPIC - Abnormal; Notable for the following:    Glucose, UA 100 (*)    Hgb urine dipstick SMALL (*)    All other components within normal limits  URINE RAPID DRUG SCREEN (HOSP PERFORMED) - Abnormal; Notable for the following:    Benzodiazepines POSITIVE (*)    All other components within normal limits  URINE MICROSCOPIC-ADD ON - Abnormal; Notable for the following:    Bacteria, UA FEW (*)    All other components within normal limits  SALICYLATE LEVEL  PROTIME-INR  I-STAT TROPOININ, ED    Imaging Review Ct Abdomen Pelvis Wo Contrast  05/14/2014   CLINICAL DATA:  Chest and mid abdominal pain. History of  multiple medical problems including chronic obstructive pulmonary disease, hypertension, diabetes and coronary artery disease. Concern for pulmonary embolism.  EXAM: CT CHEST, ABDOMEN AND PELVIS WITHOUT CONTRAST  TECHNIQUE: Multidetector CT imaging of the chest, abdomen and pelvis was performed following the standard protocol without IV contrast.  COMPARISON:  None.  FINDINGS: CT CHEST FINDINGS  Study was ordered and initially attempted as a CTA. However, there was contrast extravasation from the IV in the right upper arm. 76 ml of Omnipaque 350 was infiltrated into the right upper arm soft tissues. I examined the patient and demonstrated no skin or pulse changes. Cold compresses were applied. A follow-up scout image of the right upper arm demonstrates the contrast dispersed throughout the medial soft tissues of the right upper arm. Contrast extravasation orders will be placed in the patient's chart. Noncontrast examinations of the chest, abdomen  and pelvis for subsequently requested.  Mediastinum: There are no enlarged mediastinal or hilar lymph nodes. The thyroid gland and trachea demonstrate no significant findings. There is a small hiatal hernia. The heart size is normal. There is trace pericardial fluid versus thickening.Coronary artery calcifications are noted. There is mild atherosclerosis of the aorta and great vessels.  Lungs/Pleura: There is no pleural effusion.The lungs demonstrate moderate centrilobular and paraseptal emphysema. There is an irregular density within the lingula on axial images 30 through 40. This has a linear configuration on the reformatted images and is most consistent with scarring. There is no confluent airspace opacity or suspicious pulmonary nodule.  Musculoskeletal/Chest wall: Bilateral gynecomastia noted. There are no worrisome osseous findings. There are bone islands and old rib fractures on the left.  CT ABDOMEN AND PELVIS FINDINGS  Hepatobiliary: As evaluated in the noncontrast  state, the liver demonstrates no focal abnormality. No evidence of gallstones, gallbladder wall thickening or biliary dilatation.  Pancreas: Unremarkable. No pancreatic ductal dilatation or surrounding inflammatory changes.  Spleen: Normal in size without focal abnormality.  Adrenals/Urinary Tract: Both adrenal glands appear normal.There is a small amount of contrast material within the renal collecting systems bilaterally. There is no evidence of urinary tract calculus or hydronephrosis. There is no evidence of renal mass. A small amount of air is present within the urinary bladder which otherwise appears unremarkable.  Stomach/Bowel: No evidence of bowel wall thickening, distention or surrounding inflammatory change.The appendix appears normal. There are diverticular changes of the sigmoid colon. No extraluminal fluid collection or ascites demonstrated.  Vascular/Lymphatic: There are no enlarged abdominal or pelvic lymph nodes. Mild aortoiliac atherosclerosis noted.  Reproductive: The prostate gland and seminal vesicles demonstrate no significant findings.  Other: No evidence of abdominal wall mass or hernia.  Musculoskeletal: No acute or significant osseous findings.  IMPRESSION: 1. No acute findings demonstrated within the chest, abdomen or pelvis on noncontrast imaging. 2. Attempted contrast-enhanced study aborted secondary to contrast extravasation into the right upper arm. Post extravasation orders are to be placed in the patient's chart. Elevation of the right arm, cold compresses and close clinical observation recommended. 3. Probable scarring in the lingula. Although not morphologically very concerning for malignancy, given presumed risk factors for lung cancer, chest CT follow-up in 6 months suggested. 4. Atherosclerosis as described.   Electronically Signed   By: Camie Patience M.D.   On: 05/14/2014 15:58   Dg Chest 2 View  05/14/2014   CLINICAL DATA:  Cough and chest pain  EXAM: CHEST  2 VIEW   COMPARISON:  None.  FINDINGS: Cardiac shadow is within normal limits. The lungs are clear bilaterally. Old rib fractures with healing are noted on the right. No acute abnormality is seen.  IMPRESSION: No active cardiopulmonary disease.   Electronically Signed   By: Inez Catalina M.D.   On: 05/14/2014 12:24   Ct Chest Wo Contrast  05/14/2014   CLINICAL DATA:  Chest and mid abdominal pain. History of multiple medical problems including chronic obstructive pulmonary disease, hypertension, diabetes and coronary artery disease. Concern for pulmonary embolism.  EXAM: CT CHEST, ABDOMEN AND PELVIS WITHOUT CONTRAST  TECHNIQUE: Multidetector CT imaging of the chest, abdomen and pelvis was performed following the standard protocol without IV contrast.  COMPARISON:  None.  FINDINGS: CT CHEST FINDINGS  Study was ordered and initially attempted as a CTA. However, there was contrast extravasation from the IV in the right upper arm. 76 ml of Omnipaque 350 was infiltrated into the right upper  arm soft tissues. I examined the patient and demonstrated no skin or pulse changes. Cold compresses were applied. A follow-up scout image of the right upper arm demonstrates the contrast dispersed throughout the medial soft tissues of the right upper arm. Contrast extravasation orders will be placed in the patient's chart. Noncontrast examinations of the chest, abdomen and pelvis for subsequently requested.  Mediastinum: There are no enlarged mediastinal or hilar lymph nodes. The thyroid gland and trachea demonstrate no significant findings. There is a small hiatal hernia. The heart size is normal. There is trace pericardial fluid versus thickening.Coronary artery calcifications are noted. There is mild atherosclerosis of the aorta and great vessels.  Lungs/Pleura: There is no pleural effusion.The lungs demonstrate moderate centrilobular and paraseptal emphysema. There is an irregular density within the lingula on axial images 30 through 40.  This has a linear configuration on the reformatted images and is most consistent with scarring. There is no confluent airspace opacity or suspicious pulmonary nodule.  Musculoskeletal/Chest wall: Bilateral gynecomastia noted. There are no worrisome osseous findings. There are bone islands and old rib fractures on the left.  CT ABDOMEN AND PELVIS FINDINGS  Hepatobiliary: As evaluated in the noncontrast state, the liver demonstrates no focal abnormality. No evidence of gallstones, gallbladder wall thickening or biliary dilatation.  Pancreas: Unremarkable. No pancreatic ductal dilatation or surrounding inflammatory changes.  Spleen: Normal in size without focal abnormality.  Adrenals/Urinary Tract: Both adrenal glands appear normal.There is a small amount of contrast material within the renal collecting systems bilaterally. There is no evidence of urinary tract calculus or hydronephrosis. There is no evidence of renal mass. A small amount of air is present within the urinary bladder which otherwise appears unremarkable.  Stomach/Bowel: No evidence of bowel wall thickening, distention or surrounding inflammatory change.The appendix appears normal. There are diverticular changes of the sigmoid colon. No extraluminal fluid collection or ascites demonstrated.  Vascular/Lymphatic: There are no enlarged abdominal or pelvic lymph nodes. Mild aortoiliac atherosclerosis noted.  Reproductive: The prostate gland and seminal vesicles demonstrate no significant findings.  Other: No evidence of abdominal wall mass or hernia.  Musculoskeletal: No acute or significant osseous findings.  IMPRESSION: 1. No acute findings demonstrated within the chest, abdomen or pelvis on noncontrast imaging. 2. Attempted contrast-enhanced study aborted secondary to contrast extravasation into the right upper arm. Post extravasation orders are to be placed in the patient's chart. Elevation of the right arm, cold compresses and close clinical observation  recommended. 3. Probable scarring in the lingula. Although not morphologically very concerning for malignancy, given presumed risk factors for lung cancer, chest CT follow-up in 6 months suggested. 4. Atherosclerosis as described.   Electronically Signed   By: Camie Patience M.D.   On: 05/14/2014 15:58     EKG Interpretation   Date/Time:  Saturday May 14 2014 11:07:44 EST Ventricular Rate:  101 PR Interval:  190 QRS Duration: 84 QT Interval:  385 QTC Calculation: 499 R Axis:   -153 Text Interpretation:  Sinus tachycardia with irregular rate Right axis  deviation Low voltage, precordial leads Probable anterolateral infarct,  old Borderline T abnormalities, inferior leads No significant change since  last tracing Confirmed by Vidalia Serpas  MD, Tonnie Stillman ([id number]) on 05/14/2014 11:19:26 AM      MDM   Final diagnoses:  Chest pain  Abdominal pain  Shortness of breath   Mitchell Molina is a 74 y.o. male here with chest pain, ab swelling, vomiting, suicidal. Also hypoxic. Given hx of prostate cancer and not  undergoing treatment, will do CT chest/ab/pel to assess and to see if he has PE. Will check labs. Will likely admit   4:21 PM Ammonia level slightly elevated. Given lactulose. K 2.7. Supplemented. I placed R arm IV initially but infiltrated during CT. Unable to get contrast CT. Noncontrast CT showed no cirrhosis but possible scarring lingula. I placed another IV L upper arm. Will admit for COPD, hypoxia, hypokalemia, hepatic encephalopathy.     Wandra Arthurs, MD 05/14/14 Mancos Veronique Warga, MD 05/14/14 [phone]

## 2014-05-14 NOTE — ED Notes (Signed)
Patient returned from CT

## 2014-05-14 NOTE — Progress Notes (Signed)
MD on call notified that patient refusing lab draws at this time, stating "if you don't have an ultrasound machine, you're not coming near me with a needle".  MD notified that patient requesting PRN for heartburn and that CIWA score 12.  Will give ativan per orders and report to night RN, who will continue to monitor.

## 2014-05-14 NOTE — ED Notes (Signed)
CT called to report IV infiltrated during testing. Notified Dr. Darl Householder.

## 2014-05-14 NOTE — ED Notes (Signed)
Sitter accompanying to [employer]

## 2014-05-15 ENCOUNTER — Encounter (HOSPITAL_COMMUNITY): Payer: Self-pay | Admitting: Cardiology

## 2014-05-15 DIAGNOSIS — R45851 Suicidal ideations: Secondary | ICD-10-CM | POA: Diagnosis not present

## 2014-05-15 DIAGNOSIS — R079 Chest pain, unspecified: Secondary | ICD-10-CM

## 2014-05-15 DIAGNOSIS — D649 Anemia, unspecified: Secondary | ICD-10-CM

## 2014-05-15 DIAGNOSIS — R072 Precordial pain: Secondary | ICD-10-CM

## 2014-05-15 DIAGNOSIS — M545 Low back pain: Secondary | ICD-10-CM

## 2014-05-15 DIAGNOSIS — F333 Major depressive disorder, recurrent, severe with psychotic symptoms: Secondary | ICD-10-CM | POA: Diagnosis not present

## 2014-05-15 DIAGNOSIS — F1910 Other psychoactive substance abuse, uncomplicated: Secondary | ICD-10-CM

## 2014-05-15 DIAGNOSIS — R0789 Other chest pain: Secondary | ICD-10-CM | POA: Diagnosis not present

## 2014-05-15 DIAGNOSIS — R45850 Homicidal ideations: Secondary | ICD-10-CM

## 2014-05-15 DIAGNOSIS — F1020 Alcohol dependence, uncomplicated: Secondary | ICD-10-CM | POA: Diagnosis present

## 2014-05-15 LAB — MAGNESIUM: Magnesium: 1.6 mg/dL (ref 1.5–2.5)

## 2014-05-15 LAB — HEMOGLOBIN A1C
Hgb A1c MFr Bld: 6.4 % — ABNORMAL HIGH (ref <5.7)
Mean Plasma Glucose: 137 mg/dL — ABNORMAL HIGH (ref <117)

## 2014-05-15 LAB — BASIC METABOLIC PANEL
Anion gap: 8 (ref 5–15)
CO2: 25 mmol/L (ref 19–32)
Calcium: 7.5 mg/dL — ABNORMAL LOW (ref 8.4–10.5)
Chloride: 102 mmol/L (ref 96–112)
Creatinine, Ser: 0.78 mg/dL (ref 0.50–1.35)
GFR calc non Af Amer: 89 mL/min — ABNORMAL LOW (ref >90)
Glucose, Bld: 211 mg/dL — ABNORMAL HIGH (ref 70–99)
POTASSIUM: 4.5 mmol/L (ref 3.5–5.1)
Sodium: 135 mmol/L (ref 135–145)

## 2014-05-15 LAB — TROPONIN I: Troponin I: <0.03 ng/mL (ref <0.031)

## 2014-05-15 LAB — HEPATITIS PANEL, ACUTE
HCV AB: NEGATIVE
HEP A IGM: NONREACTIVE
HEP B C IGM: NONREACTIVE
Hepatitis B Surface Ag: NEGATIVE

## 2014-05-15 LAB — PHOSPHORUS: Phosphorus: 2.5 mg/dL (ref 2.3–4.6)

## 2014-05-15 MED ORDER — TRAZODONE HCL 100 MG PO TABS
100.0000 mg | ORAL_TABLET | Freq: Every evening | ORAL | Status: DC | PRN
  Administered 2014-05-18 – 2014-05-20 (×2): 100 mg via ORAL
  Filled 2014-05-15 (×4): qty 1

## 2014-05-15 MED ORDER — DICLOFENAC SODIUM 1 % TD GEL
2.0000 g | Freq: Four times a day (QID) | TRANSDERMAL | Status: DC
  Administered 2014-05-15 – 2014-05-21 (×15): 2 g via TOPICAL
  Filled 2014-05-15: qty 100

## 2014-05-15 MED ORDER — FLUOXETINE HCL 20 MG PO CAPS
20.0000 mg | ORAL_CAPSULE | Freq: Every day | ORAL | Status: DC
  Administered 2014-05-15 – 2014-05-21 (×7): 20 mg via ORAL
  Filled 2014-05-15 (×8): qty 1

## 2014-05-15 MED ORDER — OXYCODONE HCL 5 MG PO TABS
5.0000 mg | ORAL_TABLET | Freq: Four times a day (QID) | ORAL | Status: DC | PRN
  Administered 2014-05-15 – 2014-05-17 (×5): 5 mg via ORAL
  Filled 2014-05-15 (×5): qty 1

## 2014-05-15 NOTE — Consult Note (Signed)
CARDIOLOGY CONSULT NOTE  Patient ID: Mitchell Molina MRN: [id number] DOB/AGE: 1941/03/25 74 y.o.  Admit date: 05/14/2014 Primary Physician No primary care provider on file. Primary Cardiologist None Chief Complaint  Chest pain  HPI:  The patient reports a history of strokes and CAD.  He came to the ED via EMS with complaints of chest pain.  He was also, by report indicating thoughts of harming himself and others.   He reports a history of CAD with two heart attacks in two different counties.  However, he doesn't think that he ever had a cath and we have no information about these events. He has had no recent work up.  He says that he has been getting chest pain for about 5 months "off and on."  He reports pain in the lower chest or epigastric area.  It is "sharp and dull."  He does not report neck or arm pain.  He is very sedentary other than cutting wood.  He says this activity might bring on pain.  He reports that it is 9/10 and might last for 2 hours.  He reports associated nausea, SOB and diaphoresis.  The last episode was apparently yesterday.  It goes away on its own.  He does not report that it has been getting worse.  He denies PND or orthopnea.  In the hospital two sets of enzymes are negative.  The patient refused the third set.  EKG was non acute.  Echo prelim shows a small pericardial effusion and NL LV global function.    Past Medical History  Diagnosis Date  . COPD (chronic obstructive pulmonary disease)   . Diabetes mellitus without complication   . Hypertension   . Coronary artery disease   . Arthritis   . Asthma   . Cancer   . CHF (congestive heart failure)   . Seizures   . Stroke   . Renal disorder   . Cirrhosis     Past Surgical History  Procedure Laterality Date  . Hernia repair    . Hemorrhoid surgery      Allergies  Allergen Reactions  . Asa [Aspirin] Other (See Comments)    States he doesn't know, but he will not take it  . Tylenol [Acetaminophen] Other  (See Comments)    Can not take due to Liver issues   Meds The patient says that he takes meds but he doesn't know what they are.  No record available. No prescriptions prior to admission   Family History  Problem Relation Age of Onset  . CVA Father   . Colon cancer Mother     History   Social History  . Marital Status: Legally Separated    Spouse Name: N/A    Number of Children: 0  . Years of Education: N/A   Occupational History  . Not on file.   Social History Main Topics  . Smoking status: Current Some Day Smoker  . Smokeless tobacco: Not on file     Comment: He says that he quit two days ago.   . Alcohol Use: 0.0 oz/week    0 Not specified per week     Comment: daily; liquor or wine; last drink yesterday  . Drug Use: No  . Sexual Activity: Not on file   Other Topics Concern  . Not on file   Social History Narrative   Lives alone.      ROS:  Positive for small GI bleeding periodically noted, leg weakness and falls.  Otherwise as stated in the HPI and negative for all other systems.  Physical Exam: Blood pressure 123/60, pulse 83, temperature 97.9 F (36.6 C), temperature source Oral, resp. rate 18, height 5\' 10"  (1.778 m), weight 229 lb 15 oz (104.3 kg), SpO2 95 %.  GENERAL:  Disheveled but in no distress HEENT:  Pupils equal round and reactive, fundi not visualized, oral mucosa unremarkable, edentulous.   NECK:  No jugular venous distention, waveform within normal limits, carotid upstroke brisk and symmetric, no bruits, no thyromegaly LYMPHATICS:  No cervical, inguinal adenopathy LUNGS:  Clear to auscultation bilaterally BACK:  No CVA tenderness CHEST:  Unremarkable HEART:  PMI not displaced or sustained,S1 and S2 within normal limits, no S3, no S4, no clicks, no rubs, no murmurs ABD:  Flat, positive bowel sounds normal in frequency in pitch, no bruits, no rebound, no guarding, no midline pulsatile mass, no hepatomegaly, no splenomegaly EXT:  2 plus pulses  throughout, diffuse nonpitting edema, no cyanosis no clubbing SKIN:  No rashes no nodules, multiple skin abrasions.   NEURO:  Cranial nerves II through XII grossly intact, motor grossly intact throughout PSYCH:  Cognitively intact, oriented to person place and time   Labs: Lab Results  Component Value Date   BUN <5* 05/15/2014   Lab Results  Component Value Date   CREATININE 0.78 05/15/2014   Lab Results  Component Value Date   NA 135 05/15/2014   K 4.5 05/15/2014   CL 102 05/15/2014   CO2 25 05/15/2014   Lab Results  Component Value Date   TROPONINI <0.03 05/15/2014   Lab Results  Component Value Date   WBC 6.1 05/14/2014   HGB 10.8* 05/14/2014   HCT 32.5* 05/14/2014   MCV 83.5 05/14/2014   PLT 158 05/14/2014   No results found for: CHOL, HDL, LDLCALC, LDLDIRECT, TRIG, CHOLHDL Lab Results  Component Value Date   ALT 27 05/14/2014   AST 26 05/14/2014   ALKPHOS 84 05/14/2014   BILITOT 0.3 05/14/2014      Radiology:   CT CHEST : 1. No acute findings demonstrated within the chest, abdomen or pelvis on noncontrast imaging. 2. Attempted contrast-enhanced study aborted secondary to contrast extravasation into the right upper arm. Post extravasation orders are to be placed in the patient's chart. Elevation of the right arm, cold compresses and close clinical observation recommended. 3. Probable scarring in the lingula. Although not morphologically very concerning for malignancy, given presumed risk factors for lung cancer, chest CT follow-up in 6 months suggested. 4. Atherosclerosis as described.  EKG:    NSR, rate 85, RAD, poor anterior R wave progression.  Possible old inferior MI.  No acute ST T wave changes.  05/15/2014   ASSESSMENT AND PLAN:   CHEST PAIN:  No objective evidence of ischemia.  Plan Lexiscan Myoview in the AM.  If no high risk findings then no further evaluation will be needed.    ABNORMAL CHEST CT:  Follow up suggested.  Plans per primary  team.    Signed: Minus Breeding 05/15/2014, 1:53 PM

## 2014-05-15 NOTE — Progress Notes (Signed)
Echocardiogram 2D Echocardiogram has been performed.  Mitchell Molina 05/15/2014, 1:41 PM

## 2014-05-15 NOTE — Consult Note (Signed)
[facility] Face-to-Face Psychiatry Consult   Reason for Consult: depression and suicidal thoughts Referring Physician:  Dr. Posey Pronto Patient Identification: Mitchell Molina MRN:  [id number] Principal Diagnosis: Severe recurrent major depressive disorder with psychotic features Diagnosis:   Patient Active Problem List   Diagnosis Date Noted  . Severe recurrent major depressive disorder with psychotic features [F33.3] 05/15/2014    Priority: High  . Alcohol use disorder, moderate, dependence [F10.20] 05/15/2014    Priority: High  . Chest pain [R07.9]   . Suicidal ideation [R45.851]   . Hypokalemia [E87.6] 05/14/2014    Total Time spent with patient: 1 hour  Subjective:   Mitchell Molina is a 74 y.o. male patient admitted with chest pain and suicidal thoughts.  HPI: Mitchell Molina is a 74 y.o. male with history of major depression, alcoholism, cirrhosis, COPD who presents to the hospital with chest pain, depression, suicidal ideation, abdominal pain, vomiting. Patient reports prior history of treatment for depression, currently he is reporting worsening depressive symptoms such as low energy level, lack of motivation, poor concentration, insomnia, feeling hopeless, helpless and suicidal thoughts with no plan. Patient also endorses hearing voices telling him that people are going to kill him, also feels that people are out to get him. Patient reports that he is currently homeless. He is not on medications for his depression but has been drinking alcohol to self medicate. Patient reports long history of drinking, drinks about 3-4 days per week when he has money and reports history of black out and tremors. He denies history of drug use but smokes at least a pack of cigarette daily. Patient States that he has cirrhosis from the alcohol and has been vomiting from that. He hasn't been seeing a doctor for years. Also states that he has prostate cancer but is not currently getting treated. He is requesting  to get help for the treatment of his depression.  HPI Elements:   Location:  depression, alcohol dependence. Quality:  severe . Duration:  chronic. Context:  homeless.  Past Medical History:  Past Medical History  Diagnosis Date  . COPD (chronic obstructive pulmonary disease)   . Diabetes mellitus without complication   . Hypertension   . Coronary artery disease   . Arthritis   . Asthma   . Cancer   . CHF (congestive heart failure)   . Seizures   . Stroke   . Renal disorder   . Cirrhosis     Past Surgical History  Procedure Laterality Date  . Hernia repair    . Hemorrhoid surgery     Family History:  Family History  Problem Relation Age of Onset  . CVA Father   . Colon cancer Mother    Social History:  History  Alcohol Use  . 0.0 oz/week  . 0 Not specified per week    Comment: daily; liquor or wine; last drink yesterday     History  Drug Use No    History   Social History  . Marital Status: Legally Separated    Spouse Name: N/A    Number of Children: 0  . Years of Education: N/A   Social History Main Topics  . Smoking status: Current Some Day Smoker  . Smokeless tobacco: None     Comment: He says that he quit two days ago.   . Alcohol Use: 0.0 oz/week    0 Not specified per week     Comment: daily; liquor or wine; last drink yesterday  .  Drug Use: No  . Sexual Activity: None   Other Topics Concern  . None   Social History Narrative   Lives alone.    Additional Social History:                          Allergies:   Allergies  Allergen Reactions  . Asa [Aspirin] Other (See Comments)    States he doesn't know, but he will not take it  . Tylenol [Acetaminophen] Other (See Comments)    Can not take due to Liver issues    Vitals: Blood pressure 99/47, pulse 95, temperature 98.2 F (36.8 C), temperature source Oral, resp. rate 18, height 5\' 10"  (1.778 m), weight 104.3 kg (229 lb 15 oz), SpO2 94 %.  Risk to Self: Is patient at risk  for suicide?: Yes Risk to Others:   Prior Inpatient Therapy:   Prior Outpatient Therapy:    Current Facility-Administered Medications  Medication Dose Route Frequency Provider Last Rate Last Dose  . budesonide-formoterol (SYMBICORT) 160-4.5 MCG/ACT inhaler 2 puff  2 puff Inhalation BID Bethena Roys, MD   2 puff at 05/14/14 2109  . diclofenac sodium (VOLTAREN) 1 % transdermal gel 2 g  2 g Topical QID Ejiroghene Arlyce Dice, MD   2 g at 05/15/14 1347  . enoxaparin (LOVENOX) injection 40 mg  40 mg Subcutaneous Q24H Ejiroghene E Emokpae, MD   40 mg at 53/66/44 0347  . folic acid (FOLVITE) tablet 1 mg  1 mg Oral Daily Ejiroghene Arlyce Dice, MD   1 mg at 05/15/14 0948  . ipratropium-albuterol (DUONEB) 0.5-2.5 (3) MG/3ML nebulizer solution 3 mL  3 mL Nebulization Q6H PRN Ejiroghene E Emokpae, MD      . lactulose (CHRONULAC) 10 GM/15ML solution 10 g  10 g Oral Daily Ejiroghene Arlyce Dice, MD   10 g at 05/15/14 0947  . levofloxacin (LEVAQUIN) tablet 750 mg  750 mg Oral Daily Ejiroghene E Denton Brick, MD   750 mg at 05/15/14 0947  . LORazepam (ATIVAN) tablet 1 mg  1 mg Oral Q6H PRN Ejiroghene Arlyce Dice, MD       Or  . LORazepam (ATIVAN) injection 1 mg  1 mg Intravenous Q6H PRN Ejiroghene Arlyce Dice, MD   1 mg at 05/15/14 1149  . multivitamin with minerals tablet 1 tablet  1 tablet Oral Daily Ejiroghene Arlyce Dice, MD   1 tablet at 05/15/14 0947  . oxyCODONE (Oxy IR/ROXICODONE) immediate release tablet 5 mg  5 mg Oral Q6H PRN Charlott Rakes, MD   5 mg at 05/15/14 1227  . pantoprazole (PROTONIX) injection 40 mg  40 mg Intravenous Q12H Ejiroghene E Denton Brick, MD   40 mg at 05/15/14 0957  . predniSONE (DELTASONE) tablet 40 mg  40 mg Oral Q breakfast Ejiroghene Arlyce Dice, MD   40 mg at 05/14/14 2019  . sodium chloride 0.9 % injection 3 mL  3 mL Intravenous Q12H Ejiroghene Arlyce Dice, MD   3 mL at 05/15/14 0948  . thiamine (VITAMIN B-1) tablet 100 mg  100 mg Oral Daily Ejiroghene E Denton Brick, MD   100 mg at 05/15/14  0947   Or  . thiamine (B-1) injection 100 mg  100 mg Intravenous Daily Ejiroghene Arlyce Dice, MD        Musculoskeletal: Strength & Muscle Tone: abnormal Gait & Station: unsteady Patient leans: forward  Psychiatric Specialty Exam: Physical Exam  Review of Systems  Constitutional: Positive for malaise/fatigue.  Eyes: Negative.  Respiratory: Negative.   Gastrointestinal: Positive for nausea.  Genitourinary: Negative.   Musculoskeletal: Positive for myalgias.  Skin: Negative.   Neurological: Positive for weakness.  Endo/Heme/Allergies: Negative.   Psychiatric/Behavioral: Positive for depression, suicidal ideas, hallucinations and substance abuse. The patient is nervous/anxious and has insomnia.     Blood pressure 99/47, pulse 95, temperature 98.2 F (36.8 C), temperature source Oral, resp. rate 18, height 5\' 10"  (1.778 m), weight 104.3 kg (229 lb 15 oz), SpO2 94 %.Body mass index is 32.99 kg/(m^2).  General Appearance: Casual  Eye Contact::  Good  Speech:  Clear and Coherent  Volume:  Normal  Mood:  Depressed and Dysphoric  Affect:  Constricted  Thought Process:  Goal Directed  Orientation:  Full (Time, Place, and Person)  Thought Content:  Delusions and Hallucinations: Auditory  Suicidal Thoughts:  Yes.  with intent/plan  Homicidal Thoughts:  No  Memory:  Immediate;   Fair Recent;   Fair Remote;   Fair  Judgement:  Impaired  Insight:  Shallow  Psychomotor Activity:  Decreased  Concentration:  Fair  Recall:  [employer] of Knowledge:Good  Language: Good  Akathisia:  No  Handed:  Right  AIMS (if indicated):     Assets:  Communication Skills Desire for Improvement  ADL's:  Intact  Cognition: WNL  Sleep:   poor   Medical Decision Making: Established Problem, Worsening (2)  Problem Points: Established problem, worsening (2)  Data Points: Review or order clinical lab tests (1) Review of medication regiment & side effects (2) Review of new medications or change in  dosage (2)  Treatment Plan Summary: Daily contact with patient to assess and evaluate symptoms and progress in treatment.  Medication management: Continue Alcohol detox protocol Initiate Prozac 20mg  daily for depression. Initiate Trazodone 100mg  qhs PRN for insomnia  Plan:  Recommend psychiatric Inpatient admission when medically cleared. Disposition: Please inform social worker to seek for placement in a [facility] when patient is medically cleared.  Corena Pilgrim, MD 05/15/2014 2:56 PM

## 2014-05-15 NOTE — Progress Notes (Signed)
Utilization review completed.  

## 2014-05-15 NOTE — Progress Notes (Signed)
Pt refuses blood draw per lab tech and RN.    Nurse educated pt on importance of getting blood for lab test. Pt acknowledged understanding but refused to have blood drawn.   Angeline Slim I 05/15/2014 11:38 AM

## 2014-05-15 NOTE — Progress Notes (Signed)
MD order noted re: need to refer patient for geriatric psych hospitalization.  CSW will notify Nonnie Done, [place] for follow up Monday.  Lorie Phenix. Pauline Good, [place]

## 2014-05-15 NOTE — Progress Notes (Addendum)
Subjective: This AM, he was asleep when I went in his room. He reports pain in his head and lower back. I explained that some of his headache might be from coming off his alcohol. He continues to report having though of wanting to kill himself and others though denied any auditory/visual hallucinations.   Later this AM, RN called that he refused lab draw and wanted something for his lower back pain.  Objective: Vital signs in last 24 hours: Filed Vitals:   05/14/14 2016 05/14/14 2109 05/15/14 0159 05/15/14 0507  BP: 123/56  117/54 123/60  Pulse: 97  89 83  Temp: 99 F (37.2 C)  98.5 F (36.9 C) 97.9 F (36.6 C)  TempSrc: Oral  Oral Oral  Resp: 22  20 18   Height:      Weight:      SpO2: 98% 98% 95% 95%   Weight change:   Intake/Output Summary (Last 24 hours) at 05/15/14 1352 Last data filed at 05/15/14 1334  Gross per 24 hour  Intake 2056.66 ml  Output    850 ml  Net 1206.66 ml   General: resting in bed, NAD, 2L O2 by [place] HEENT: PERRL, EOMI, no scleral icterus,  Cardiac: RRR, no rubs, murmurs or gallops Pulm: bibasilar wheezing Abd: soft, distended with mild tenderness all over on palpation, BS present Ext: warm and well perfused, no pedal edema Neuro: oriented to name and place though not to date or president as he reports "not keeping up with that," responds to questions appropriately; moving all extremities freely  Lab Results: Basic Metabolic Panel:  Recent Labs Lab 05/14/14 1142 05/14/14 2330 05/15/14 0200  NA 135  --  135  K 2.7*  --  4.5  CL 98  --  102  CO2 24  --  25  GLUCOSE 184*  --  211*  BUN <5*  --  <5*  CREATININE 0.71  --  0.78  CALCIUM 7.5*  --  7.5*  MG  --  1.6  --   PHOS  --  2.5  --    Liver Function Tests:  Recent Labs Lab 05/14/14 1142  AST 26  ALT 27  ALKPHOS 84  BILITOT 0.3  PROT 5.4*  ALBUMIN 3.0*    Recent Labs Lab 05/14/14 1142  AMMONIA 68*   CBC:  Recent Labs Lab 05/14/14 1142  WBC 6.1  NEUTROABS 5.0  HGB  10.8*  HCT 32.5*  MCV 83.5  PLT 158   Cardiac Enzymes:  Recent Labs Lab 05/14/14 2316 05/15/14 0509  TROPONINI <0.03 <0.03   Coagulation:  Recent Labs Lab 05/14/14 1142  LABPROT 14.1  INR 1.07   Urine Drug Screen: Drugs of Abuse     Component Value Date/Time   LABOPIA NONE DETECTED 05/14/2014 1206   COCAINSCRNUR NONE DETECTED 05/14/2014 1206   LABBENZ POSITIVE* 05/14/2014 1206   AMPHETMU NONE DETECTED 05/14/2014 1206   THCU NONE DETECTED 05/14/2014 1206   LABBARB NONE DETECTED 05/14/2014 1206    Alcohol Level:  Recent Labs Lab 05/14/14 1142  ETH 112*   Urinalysis:  Recent Labs Lab 05/14/14 1206  COLORURINE YELLOW  LABSPEC 1.009  PHURINE 6.0  GLUCOSEU 100*  HGBUR SMALL*  BILIRUBINUR NEGATIVE  KETONESUR NEGATIVE  PROTEINUR NEGATIVE  UROBILINOGEN 1.0  NITRITE NEGATIVE  LEUKOCYTESUR NEGATIVE     Micro Results: Recent Results (from the past 240 hour(s))  MRSA PCR Screening     Status: None   Collection Time: 05/14/14  7:27 PM  Result Value Ref Range Status   MRSA by PCR NEGATIVE NEGATIVE Final    Comment:        The GeneXpert MRSA Assay (FDA approved for NASAL specimens only), is one component of a comprehensive MRSA colonization surveillance program. It is not intended to diagnose MRSA infection nor to guide or monitor treatment for MRSA infections.    Studies/Results: Ct Abdomen Pelvis Wo Contrast  05/14/2014   CLINICAL DATA:  Chest and mid abdominal pain. History of multiple medical problems including chronic obstructive pulmonary disease, hypertension, diabetes and coronary artery disease. Concern for pulmonary embolism.  EXAM: CT CHEST, ABDOMEN AND PELVIS WITHOUT CONTRAST  TECHNIQUE: Multidetector CT imaging of the chest, abdomen and pelvis was performed following the standard protocol without IV contrast.  COMPARISON:  None.  FINDINGS: CT CHEST FINDINGS  Study was ordered and initially attempted as a CTA. However, there was contrast  extravasation from the IV in the right upper arm. 76 ml of Omnipaque 350 was infiltrated into the right upper arm soft tissues. I examined the patient and demonstrated no skin or pulse changes. Cold compresses were applied. A follow-up scout image of the right upper arm demonstrates the contrast dispersed throughout the medial soft tissues of the right upper arm. Contrast extravasation orders will be placed in the patient's chart. Noncontrast examinations of the chest, abdomen and pelvis for subsequently requested.  Mediastinum: There are no enlarged mediastinal or hilar lymph nodes. The thyroid gland and trachea demonstrate no significant findings. There is a small hiatal hernia. The heart size is normal. There is trace pericardial fluid versus thickening.Coronary artery calcifications are noted. There is mild atherosclerosis of the aorta and great vessels.  Lungs/Pleura: There is no pleural effusion.The lungs demonstrate moderate centrilobular and paraseptal emphysema. There is an irregular density within the lingula on axial images 30 through 40. This has a linear configuration on the reformatted images and is most consistent with scarring. There is no confluent airspace opacity or suspicious pulmonary nodule.  Musculoskeletal/Chest wall: Bilateral gynecomastia noted. There are no worrisome osseous findings. There are bone islands and old rib fractures on the left.  CT ABDOMEN AND PELVIS FINDINGS  Hepatobiliary: As evaluated in the noncontrast state, the liver demonstrates no focal abnormality. No evidence of gallstones, gallbladder wall thickening or biliary dilatation.  Pancreas: Unremarkable. No pancreatic ductal dilatation or surrounding inflammatory changes.  Spleen: Normal in size without focal abnormality.  Adrenals/Urinary Tract: Both adrenal glands appear normal.There is a small amount of contrast material within the renal collecting systems bilaterally. There is no evidence of urinary tract calculus or  hydronephrosis. There is no evidence of renal mass. A small amount of air is present within the urinary bladder which otherwise appears unremarkable.  Stomach/Bowel: No evidence of bowel wall thickening, distention or surrounding inflammatory change.The appendix appears normal. There are diverticular changes of the sigmoid colon. No extraluminal fluid collection or ascites demonstrated.  Vascular/Lymphatic: There are no enlarged abdominal or pelvic lymph nodes. Mild aortoiliac atherosclerosis noted.  Reproductive: The prostate gland and seminal vesicles demonstrate no significant findings.  Other: No evidence of abdominal wall mass or hernia.  Musculoskeletal: No acute or significant osseous findings.  IMPRESSION: 1. No acute findings demonstrated within the chest, abdomen or pelvis on noncontrast imaging. 2. Attempted contrast-enhanced study aborted secondary to contrast extravasation into the right upper arm. Post extravasation orders are to be placed in the patient's chart. Elevation of the right arm, cold compresses and close clinical observation recommended. 3. Probable scarring  in the lingula. Although not morphologically very concerning for malignancy, given presumed risk factors for lung cancer, chest CT follow-up in 6 months suggested. 4. Atherosclerosis as described.   Electronically Signed   By: Camie Patience M.D.   On: 05/14/2014 15:58   Dg Chest 2 View  05/14/2014   CLINICAL DATA:  Cough and chest pain  EXAM: CHEST  2 VIEW  COMPARISON:  None.  FINDINGS: Cardiac shadow is within normal limits. The lungs are clear bilaterally. Old rib fractures with healing are noted on the right. No acute abnormality is seen.  IMPRESSION: No active cardiopulmonary disease.   Electronically Signed   By: Inez Catalina M.D.   On: 05/14/2014 12:24   Ct Chest Wo Contrast  05/14/2014   CLINICAL DATA:  Chest and mid abdominal pain. History of multiple medical problems including chronic obstructive pulmonary disease,  hypertension, diabetes and coronary artery disease. Concern for pulmonary embolism.  EXAM: CT CHEST, ABDOMEN AND PELVIS WITHOUT CONTRAST  TECHNIQUE: Multidetector CT imaging of the chest, abdomen and pelvis was performed following the standard protocol without IV contrast.  COMPARISON:  None.  FINDINGS: CT CHEST FINDINGS  Study was ordered and initially attempted as a CTA. However, there was contrast extravasation from the IV in the right upper arm. 76 ml of Omnipaque 350 was infiltrated into the right upper arm soft tissues. I examined the patient and demonstrated no skin or pulse changes. Cold compresses were applied. A follow-up scout image of the right upper arm demonstrates the contrast dispersed throughout the medial soft tissues of the right upper arm. Contrast extravasation orders will be placed in the patient's chart. Noncontrast examinations of the chest, abdomen and pelvis for subsequently requested.  Mediastinum: There are no enlarged mediastinal or hilar lymph nodes. The thyroid gland and trachea demonstrate no significant findings. There is a small hiatal hernia. The heart size is normal. There is trace pericardial fluid versus thickening.Coronary artery calcifications are noted. There is mild atherosclerosis of the aorta and great vessels.  Lungs/Pleura: There is no pleural effusion.The lungs demonstrate moderate centrilobular and paraseptal emphysema. There is an irregular density within the lingula on axial images 30 through 40. This has a linear configuration on the reformatted images and is most consistent with scarring. There is no confluent airspace opacity or suspicious pulmonary nodule.  Musculoskeletal/Chest wall: Bilateral gynecomastia noted. There are no worrisome osseous findings. There are bone islands and old rib fractures on the left.  CT ABDOMEN AND PELVIS FINDINGS  Hepatobiliary: As evaluated in the noncontrast state, the liver demonstrates no focal abnormality. No evidence of  gallstones, gallbladder wall thickening or biliary dilatation.  Pancreas: Unremarkable. No pancreatic ductal dilatation or surrounding inflammatory changes.  Spleen: Normal in size without focal abnormality.  Adrenals/Urinary Tract: Both adrenal glands appear normal.There is a small amount of contrast material within the renal collecting systems bilaterally. There is no evidence of urinary tract calculus or hydronephrosis. There is no evidence of renal mass. A small amount of air is present within the urinary bladder which otherwise appears unremarkable.  Stomach/Bowel: No evidence of bowel wall thickening, distention or surrounding inflammatory change.The appendix appears normal. There are diverticular changes of the sigmoid colon. No extraluminal fluid collection or ascites demonstrated.  Vascular/Lymphatic: There are no enlarged abdominal or pelvic lymph nodes. Mild aortoiliac atherosclerosis noted.  Reproductive: The prostate gland and seminal vesicles demonstrate no significant findings.  Other: No evidence of abdominal wall mass or hernia.  Musculoskeletal: No acute or significant osseous  findings.  IMPRESSION: 1. No acute findings demonstrated within the chest, abdomen or pelvis on noncontrast imaging. 2. Attempted contrast-enhanced study aborted secondary to contrast extravasation into the right upper arm. Post extravasation orders are to be placed in the patient's chart. Elevation of the right arm, cold compresses and close clinical observation recommended. 3. Probable scarring in the lingula. Although not morphologically very concerning for malignancy, given presumed risk factors for lung cancer, chest CT follow-up in 6 months suggested. 4. Atherosclerosis as described.   Electronically Signed   By: Camie Patience M.D.   On: 05/14/2014 15:58   Medications: I have reviewed the patient's current medications. Scheduled Meds: . budesonide-formoterol  2 puff Inhalation BID  . diclofenac sodium  2 g Topical  QID  . enoxaparin (LOVENOX) injection  40 mg Subcutaneous Q24H  . folic acid  1 mg Oral Daily  . lactulose  10 g Oral Daily  . levofloxacin  750 mg Oral Daily  . multivitamin with minerals  1 tablet Oral Daily  . pantoprazole (PROTONIX) IV  40 mg Intravenous Q12H  . predniSONE  40 mg Oral Q breakfast  . sodium chloride  3 mL Intravenous Q12H  . thiamine  100 mg Oral Daily   Or  . thiamine  100 mg Intravenous Daily   Continuous Infusions:  PRN Meds:.ipratropium-albuterol, LORazepam **OR** LORazepam, oxyCODONE Assessment/Plan:  Chest pain: Resolved for now though prior self-reported history of MI x 2 is concerning for possible ACS. Last troponin couldn't be drawn as he was not agreeable to it. EKG this AM was otherwise stable. Telemetry overnight showed mostly PVCs. -Continue telemetry -Follow-up A1c -Check echo -Consult cardiology for further work-up  Low back pain: He denied falls on admission and may be chronic though abdominal girth may be contributory. NSAIDs would not be advisable in the setting of his EtOH abuse and possible ASA allergy. Opiates also not the best given his current state of withdrawal. Tylenol not ideal either given questionable liver disease.  -Give oxyIR 5mg  q6h prn for low back pain   Elevated ammonia: Abdominal exam still concerning for ascites. Mental status assessment limited as he continues to withdraw from alcohol. -Continue lactulose 10g -Check abdominal US -Continue assessing mental status  Presumed COPD exacerbation: No PFTs on file though smoking history, symptoms, and prior home oxygen therapy are suggestive of chronic disease, especially given his productive cough. Wheezing noted on exam all throughout lung fields though improved from yesterday. -Continue Duonebs q6h -Give Symbicort 2 puffs BID -Give Levaquin 750mg  [Day 2/5] -Give prednisone 40mg  [YQM 2/5]  Polysubstance abuse: CAGE 2/4 on admission along with 30-pack year smoking history. CIWA  6-7 overnight, improved from 12 on admission. Mg/P otherwise unremarkable. -Continue CIWA -Contact providers who have had interactions with him in the last six months per [place] DEA Database  Anemia: Hb 10 on admission. -Follow-up anemia panel  Homicidal & suicidal ideation: He continues to report these thoughts this AM to myself and other members of the treatment team.  -Consult psychiatry  -Continue sitter and suicide precautions -Consider IVC if he decides to leave AMA -Limit interactions with treatment team and patient to avoid agitating him, especially as his withdrawal worsens  Possible prostate cancer: He reported being told he had prostate cancer by another provider though the initial abdominal CT was without any associated findings.  -Follow-up PSA  #FEN:  -Diet: Regular -NS @ 125cc/hour  #DVT prophylaxis: Lovenox  #CODE STATUS: FULL CODE -Cannot confirm given his suicidal ideation  Dispo:  Disposition is deferred at this time, awaiting improvement of current medical problems.   The patient does not have a current PCP (No primary care provider on file.) and does not know need an [facility] hospital follow-up appointment after discharge.  The patient does have transportation limitations that hinder transportation to clinic appointments.  .Services Needed at time of discharge: Y = Yes, Blank = No PT:   OT:   RN:   Equipment:   Other:     LOS: 1 day   Charlott Rakes, MD 05/15/2014, 1:52 PM

## 2014-05-16 ENCOUNTER — Inpatient Hospital Stay (HOSPITAL_COMMUNITY): Payer: Medicare Other

## 2014-05-16 ENCOUNTER — Other Ambulatory Visit (HOSPITAL_COMMUNITY): Payer: Self-pay

## 2014-05-16 DIAGNOSIS — F333 Major depressive disorder, recurrent, severe with psychotic symptoms: Secondary | ICD-10-CM

## 2014-05-16 DIAGNOSIS — R079 Chest pain, unspecified: Secondary | ICD-10-CM

## 2014-05-16 DIAGNOSIS — R0789 Other chest pain: Secondary | ICD-10-CM | POA: Diagnosis not present

## 2014-05-16 LAB — TROPONIN I: Troponin I: <0.03 ng/mL (ref <0.031)

## 2014-05-16 MED ORDER — LORAZEPAM 2 MG/ML IJ SOLN
INTRAMUSCULAR | Status: AC
Start: 2014-05-16 — End: 2014-05-16
  Administered 2014-05-16: 1 mg via INTRAVENOUS
  Filled 2014-05-16: qty 1

## 2014-05-16 MED ORDER — TECHNETIUM TC 99M SESTAMIBI GENERIC - CARDIOLITE
30.0000 | Freq: Once | INTRAVENOUS | Status: AC | PRN
  Administered 2014-05-16: 30 via INTRAVENOUS

## 2014-05-16 MED ORDER — REGADENOSON 0.4 MG/5ML IV SOLN
INTRAVENOUS | Status: AC
  Filled 2014-05-16: qty 5

## 2014-05-16 MED ORDER — REGADENOSON 0.4 MG/5ML IV SOLN
0.4000 mg | Freq: Once | INTRAVENOUS | Status: AC
  Administered 2014-05-16: 0.4 mg via INTRAVENOUS
  Filled 2014-05-16: qty 5

## 2014-05-16 MED ORDER — TECHNETIUM TC 99M SESTAMIBI GENERIC - CARDIOLITE
10.0000 | Freq: Once | INTRAVENOUS | Status: AC | PRN
  Administered 2014-05-16: 10 via INTRAVENOUS

## 2014-05-16 MED ORDER — LORAZEPAM 2 MG/ML IJ SOLN: 1.0000 mg | Freq: Once | INTRAMUSCULAR | Status: AC | PRN

## 2014-05-16 MED ORDER — LORAZEPAM 2 MG/ML IJ SOLN
0.5000 mg | Freq: Once | INTRAMUSCULAR | Status: AC | PRN
  Administered 2014-05-16: 1 mg via INTRAVENOUS

## 2014-05-16 NOTE — Progress Notes (Signed)
Internal Medicine Attending  Date: 05/16/2014  Patient name: Mitchell Molina Medical record number: [id number] Date of birth: 30-Jun-1940 Age: 74 y.o. Gender: male  I saw and evaluated the patient, and discussed his care with resident on A.M rounds.  I reviewed the resident's note by Dr. Posey Pronto and I agree with the resident's findings and plans as documented in his note.

## 2014-05-16 NOTE — Progress Notes (Signed)
RN at pt. Bedside attempting to assess pt. Pt. Refusing assessment at this time. RN will re-attempt to assess pt.  Mitchell Molina, [name]

## 2014-05-16 NOTE — Progress Notes (Signed)
Pt. Continues to refuse for RN to complete pt. Assessment. Pt. Also refusing to have vitals signs taken at this time. Pt. Verbally agrressive. Stating that staff leaves him alone.

## 2014-05-16 NOTE — Progress Notes (Addendum)
Subjective: This AM, he was asleep but arousable on exam. He didn't remember me though reported feeling about the same with pain in his head, lower back, and intermittently in his chest.  Myoview was attempted later today though he could tolerate it and will be redone later once he receives pre-procedural Ativan.   I spoke with Dr. Laurence Ferrari Medicine] who prescribed him Diazepam back in October per [facility] DEA database. He reports seeing the patient once at his ALF, [facility]. I attempted reaching them today though their records office wouldn't open tomorrow.   I spoke with Dr. Evie Lacks [Psychiatry], the other provider listed on the [place], who reviewed her records and reported to me over the phone that he was admitted for psych hospitalization for unspecified depressive disorder and passive suicidal ideation back in October 2015. 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 He presented from one living facility with the plan to transfer to another though was discharged given the resolution of his illness. The co-morbidites documented on her records mirror those on ours from admission.   Objective: Vital signs in last 24 hours: Filed Vitals:   05/15/14 2018 05/16/14 0530 05/16/14 1058 05/16/14 1310  BP: 118/53 173/91 144/69 160/81  Pulse: 89 91 76   Temp: 98.1 F (36.7 C) 97.2 F (36.2 C)    TempSrc: Oral Oral    Resp: 18 16  20   Height:      Weight:  233 lb 14.5 oz (106.1 kg)    SpO2: 98% 99% 99%    Weight change: 33 lb 14.5 oz (15.381 kg)  Intake/Output Summary (Last 24 hours) at 05/16/14 1322 Last data filed at 05/16/14 1110  Gross per 24 hour  Intake   1233 ml  Output   2200 ml  Net   -967 ml   General: resting in bed, NAD, 2L O2 by [place] HEENT: PERRL, EOMI, no scleral icterus,  Cardiac: RRR, no rubs, murmurs or gallops Pulm: clear to auscultation bilaterally without wheezing or  crackles Abd: soft, distended with mild tenderness all over on palpation, BS present Ext: warm and well perfused, no pedal edema Neuro: responds to questions appropriately; moving all extremities freely  Lab Results: Basic Metabolic Panel:  Recent Labs Lab 05/14/14 1142 05/14/14 2330 05/15/14 0200  NA 135  --  135  K 2.7*  --  4.5  CL 98  --  102  CO2 24  --  25  GLUCOSE 184*  --  211*  BUN <5*  --  <5*  CREATININE 0.71  --  0.78  CALCIUM 7.5*  --  7.5*  MG  --  1.6  --   PHOS  --  2.5  --    Liver Function Tests:  Recent Labs Lab 05/14/14 1142  AST 26  ALT 27  ALKPHOS 84  BILITOT 0.3  PROT 5.4*  ALBUMIN 3.0*    Recent Labs Lab 05/14/14 1142  AMMONIA 68*   CBC:  Recent Labs Lab 05/14/14 1142  WBC 6.1  NEUTROABS 5.0  HGB 10.8*  HCT 32.5*  MCV 83.5  PLT 158   Cardiac Enzymes:  Recent Labs Lab 05/14/14 2316 05/15/14 0509 05/16/14 1049  TROPONINI <0.03 <0.03 <0.03   Coagulation:  Recent Labs Lab 05/14/14 1142  LABPROT 14.1  INR 1.07   Urine Drug Screen: Drugs of Abuse     Component Value Date/Time   LABOPIA NONE DETECTED  05/14/2014 1206   [place] 05/14/2014 1206   LABBENZ POSITIVE* 05/14/2014 1206   AMPHETMU NONE DETECTED 05/14/2014 1206   THCU NONE DETECTED 05/14/2014 1206   LABBARB NONE DETECTED 05/14/2014 1206    Alcohol Level:  Recent Labs Lab 05/14/14 1142  ETH 112*   Urinalysis:  Recent Labs Lab 05/14/14 1206  COLORURINE YELLOW  LABSPEC 1.009  PHURINE 6.0  GLUCOSEU 100*  HGBUR SMALL*  BILIRUBINUR NEGATIVE  KETONESUR NEGATIVE  PROTEINUR NEGATIVE  UROBILINOGEN 1.0  NITRITE NEGATIVE  LEUKOCYTESUR NEGATIVE     Micro Results: Recent Results (from the past 240 hour(s))  MRSA PCR Screening     Status: None   Collection Time: 05/14/14  7:27 PM  Result Value Ref Range Status   MRSA by PCR NEGATIVE NEGATIVE Final    Comment:        The GeneXpert MRSA Assay (FDA approved for NASAL  specimens only), is one component of a comprehensive MRSA colonization surveillance program. It is not intended to diagnose MRSA infection nor to guide or monitor treatment for MRSA infections.    Studies/Results: Ct Abdomen Pelvis Wo Contrast  05/14/2014   CLINICAL DATA:  Chest and mid abdominal pain. History of multiple medical problems including chronic obstructive pulmonary disease, hypertension, diabetes and coronary artery disease. Concern for pulmonary embolism.  EXAM: CT CHEST, ABDOMEN AND PELVIS WITHOUT CONTRAST  TECHNIQUE: Multidetector CT imaging of the chest, abdomen and pelvis was performed following the standard protocol without IV contrast.  COMPARISON:  None.  FINDINGS: CT CHEST FINDINGS  Study was ordered and initially attempted as a CTA. However, there was contrast extravasation from the IV in the right upper arm. 76 ml of Omnipaque 350 was infiltrated into the right upper arm soft tissues. I examined the patient and demonstrated no skin or pulse changes. Cold compresses were applied. A follow-up scout image of the right upper arm demonstrates the contrast dispersed throughout the medial soft tissues of the right upper arm. Contrast extravasation orders will be placed in the patient's chart. Noncontrast examinations of the chest, abdomen and pelvis for subsequently requested.  Mediastinum: There are no enlarged mediastinal or hilar lymph nodes. The thyroid gland and trachea demonstrate no significant findings. There is a small hiatal hernia. The heart size is normal. There is trace pericardial fluid versus thickening.Coronary artery calcifications are noted. There is mild atherosclerosis of the aorta and great vessels.  Lungs/Pleura: There is no pleural effusion.The lungs demonstrate moderate centrilobular and paraseptal emphysema. There is an irregular density within the lingula on axial images 30 through 40. This has a linear configuration on the reformatted images and is most  consistent with scarring. There is no confluent airspace opacity or suspicious pulmonary nodule.  Musculoskeletal/Chest wall: Bilateral gynecomastia noted. There are no worrisome osseous findings. There are bone islands and old rib fractures on the left.  CT ABDOMEN AND PELVIS FINDINGS  Hepatobiliary: As evaluated in the noncontrast state, the liver demonstrates no focal abnormality. No evidence of gallstones, gallbladder wall thickening or biliary dilatation.  Pancreas: Unremarkable. No pancreatic ductal dilatation or surrounding inflammatory changes.  Spleen: Normal in size without focal abnormality.  Adrenals/Urinary Tract: Both adrenal glands appear normal.There is a small amount of contrast material within the renal collecting systems bilaterally. There is no evidence of urinary tract calculus or hydronephrosis. There is no evidence of renal mass. A small amount of air is present within the urinary bladder which otherwise appears unremarkable.  Stomach/Bowel: No evidence of bowel wall thickening,  distention or surrounding inflammatory change.The appendix appears normal. There are diverticular changes of the sigmoid colon. No extraluminal fluid collection or ascites demonstrated.  Vascular/Lymphatic: There are no enlarged abdominal or pelvic lymph nodes. Mild aortoiliac atherosclerosis noted.  Reproductive: The prostate gland and seminal vesicles demonstrate no significant findings.  Other: No evidence of abdominal wall mass or hernia.  Musculoskeletal: No acute or significant osseous findings.  IMPRESSION: 1. No acute findings demonstrated within the chest, abdomen or pelvis on noncontrast imaging. 2. Attempted contrast-enhanced study aborted secondary to contrast extravasation into the right upper arm. Post extravasation orders are to be placed in the patient's chart. Elevation of the right arm, cold compresses and close clinical observation recommended. 3. Probable scarring in the lingula. Although not  morphologically very concerning for malignancy, given presumed risk factors for lung cancer, chest CT follow-up in 6 months suggested. 4. Atherosclerosis as described.   Electronically Signed   By: Camie Patience M.D.   On: 05/14/2014 15:58   Ct Chest Wo Contrast  05/14/2014   CLINICAL DATA:  Chest and mid abdominal pain. History of multiple medical problems including chronic obstructive pulmonary disease, hypertension, diabetes and coronary artery disease. Concern for pulmonary embolism.  EXAM: CT CHEST, ABDOMEN AND PELVIS WITHOUT CONTRAST  TECHNIQUE: Multidetector CT imaging of the chest, abdomen and pelvis was performed following the standard protocol without IV contrast.  COMPARISON:  None.  FINDINGS: CT CHEST FINDINGS  Study was ordered and initially attempted as a CTA. However, there was contrast extravasation from the IV in the right upper arm. 76 ml of Omnipaque 350 was infiltrated into the right upper arm soft tissues. I examined the patient and demonstrated no skin or pulse changes. Cold compresses were applied. A follow-up scout image of the right upper arm demonstrates the contrast dispersed throughout the medial soft tissues of the right upper arm. Contrast extravasation orders will be placed in the patient's chart. Noncontrast examinations of the chest, abdomen and pelvis for subsequently requested.  Mediastinum: There are no enlarged mediastinal or hilar lymph nodes. The thyroid gland and trachea demonstrate no significant findings. There is a small hiatal hernia. The heart size is normal. There is trace pericardial fluid versus thickening.Coronary artery calcifications are noted. There is mild atherosclerosis of the aorta and great vessels.  Lungs/Pleura: There is no pleural effusion.The lungs demonstrate moderate centrilobular and paraseptal emphysema. There is an irregular density within the lingula on axial images 30 through 40. This has a linear configuration on the reformatted images and is  most consistent with scarring. There is no confluent airspace opacity or suspicious pulmonary nodule.  Musculoskeletal/Chest wall: Bilateral gynecomastia noted. There are no worrisome osseous findings. There are bone islands and old rib fractures on the left.  CT ABDOMEN AND PELVIS FINDINGS  Hepatobiliary: As evaluated in the noncontrast state, the liver demonstrates no focal abnormality. No evidence of gallstones, gallbladder wall thickening or biliary dilatation.  Pancreas: Unremarkable. No pancreatic ductal dilatation or surrounding inflammatory changes.  Spleen: Normal in size without focal abnormality.  Adrenals/Urinary Tract: Both adrenal glands appear normal.There is a small amount of contrast material within the renal collecting systems bilaterally. There is no evidence of urinary tract calculus or hydronephrosis. There is no evidence of renal mass. A small amount of air is present within the urinary bladder which otherwise appears unremarkable.  Stomach/Bowel: No evidence of bowel wall thickening, distention or surrounding inflammatory change.The appendix appears normal. There are diverticular changes of the sigmoid colon. No extraluminal fluid collection  or ascites demonstrated.  Vascular/Lymphatic: There are no enlarged abdominal or pelvic lymph nodes. Mild aortoiliac atherosclerosis noted.  Reproductive: The prostate gland and seminal vesicles demonstrate no significant findings.  Other: No evidence of abdominal wall mass or hernia.  Musculoskeletal: No acute or significant osseous findings.  IMPRESSION: 1. No acute findings demonstrated within the chest, abdomen or pelvis on noncontrast imaging. 2. Attempted contrast-enhanced study aborted secondary to contrast extravasation into the right upper arm. Post extravasation orders are to be placed in the patient's chart. Elevation of the right arm, cold compresses and close clinical observation recommended. 3. Probable scarring in the lingula. Although not  morphologically very concerning for malignancy, given presumed risk factors for lung cancer, chest CT follow-up in 6 months suggested. 4. Atherosclerosis as described.   Electronically Signed   By: Camie Patience M.D.   On: 05/14/2014 15:58   US Abdomen Complete  05/16/2014   CLINICAL DATA:  Abdominal distention.  Hepatic cirrhosis  EXAM: ULTRASOUND ABDOMEN COMPLETE  COMPARISON:  May 14, 2014  FINDINGS: Gallbladder: No gallstones or wall thickening visualized. There is no pericholecystic fluid. No sonographic Murphy sign noted.  Common bile duct: Diameter: 8 mm, mildly prominent. No mass or calculus is appreciable in the biliary ductal system. Note that portions of the distal common bile duct are not well seen.  Liver: No focal lesion identified. Liver echogenicity is diffusely increased. Liver is enlarged, measuring approximately 21 cm in length.  IVC: No abnormality visualized.  Pancreas: Most of the pancreas is obscured by gas.  Spleen: Size and appearance within normal limits.  Right Kidney: Length: 13.2 cm. Echogenicity within normal limits. No mass or hydronephrosis visualized.  Left Kidney: Length: 13.8 cm. Echogenicity within normal limits. No mass or hydronephrosis visualized.  Abdominal aorta: No aneurysm visualized in visualized portions. Mid to distal aorta not well visualized due to overlying gas.  Other findings: No demonstrable ascites.  IMPRESSION: The liver echogenicity is increased, a finding that may be due to underlying parenchymal liver disease and/or hepatic steatosis. While no focal liver lesions are identified, it must be cautioned that the sensitivity of ultrasound for focal liver lesions is diminished in this circumstance.  Pancreas nearly completely obscured by gas.  Portions of the mid and distal aorta not well visualized due to overlying gas. Visualized portions of the aorta are non- aneurysmal.  Common bile duct measures 8 mm, mildly prominent. No mass or calculus seen. Note,  however, that the distal common bile duct is not well visualized due to overlying gas. If further imaging evaluation is felt to be warranted, MRCP could be helpful to further assess.   Electronically Signed   By: Lowella Grip M.D.   On: 05/16/2014 10:00   Medications: I have reviewed the patient's current medications. Scheduled Meds: . budesonide-formoterol  2 puff Inhalation BID  . diclofenac sodium  2 g Topical QID  . enoxaparin (LOVENOX) injection  40 mg Subcutaneous Q24H  . FLUoxetine  20 mg Oral Daily  . folic acid  1 mg Oral Daily  . lactulose  10 g Oral Daily  . levofloxacin  750 mg Oral Daily  . multivitamin with minerals  1 tablet Oral Daily  . pantoprazole (PROTONIX) IV  40 mg Intravenous Q12H  . predniSONE  40 mg Oral Q breakfast  . regadenoson  0.4 mg Intravenous Once  . sodium chloride  3 mL Intravenous Q12H  . thiamine  100 mg Oral Daily   Or  . thiamine  100  mg Intravenous Daily   Continuous Infusions:  PRN Meds:.ipratropium-albuterol, LORazepam, LORazepam **OR** LORazepam, LORazepam, oxyCODONE, technetium sestamibi generic, traZODone Assessment/Plan:  Chest pain: Per Cardiology's assessment, he did not appear to be having active ischemia though self-reported history of MI x 2 is concerning for possible ACS. Telemetry overnight otherwise unremarkable. Echo with EF 78-24%, grade 1 diastolic dysfunction, which is not suggestive of ACS.  -Retry Myoview with Ativan 1-2mg  IV prn prior to procedure -Continue telemetry  Elevated ammonia: Abdominal exam still concerning for ascites though not seen on abdominal US however liver echogenicity increased though abdominal CT without focal findings. Mental status assessment limited as he continues to withdraw from alcohol and is mostly been asleep.  -Continue lactulose 10g -Continue reassessing mental status  Low back pain: He denied falls on admission and may be chronic though abdominal girth may be contributory. NSAIDs would  not be advisable in the setting of his EtOH abuse and possible ASA allergy. Opiates also not the best given his current state of withdrawal. Tylenol not ideal either given questionable liver disease.  -Continue oxyIR 5mg  q6h prn for low back pain   Pre-diabetes: A1c 6.4 on admission. -Advise lifestyle modification once acute illness resolves  Presumed COPD exacerbation: No PFTs on file though smoking history, symptoms, and prior home oxygen therapy are suggestive of chronic disease, especially given his productive cough. Lung exam findings improved today. -Continue Duonebs q6h -Give Symbicort 2 puffs BID -Give Levaquin 750mg  [place] 3/5] -Give prednisone 40mg  [place] 3/5]  Polysubstance abuse: CAGE 2/4 on admission along with 30-pack year smoking history. CIWA 23 overnight, worse from 12 on admission. Mg/P otherwise unremarkable. -Continue CIWA  Anemia: Hb 10 on admission. Likely 2/2 chronic EtOH abuse as he has no active blood loss or signs of it. -Consider workup as outpatient  Homicidal & suicidal ideation: Recently hospitalized 3 months ago per Dr. Anders Simmonds and will require inpatient hospitalization per psychiatric assessment yesterday. He is otherwise cooperative and not agitated. -Continue Prozac 20mg  & trazodone 100mg  QHS prn for insomnia per Psych -Continue sitter and suicide precautions -Consider IVC if he decides to leave [facility] -Collect records from [place] -If necessary, contact [phone] for recs for prior psych hospitalization  Possible prostate cancer: He reported being told he had prostate cancer by another provider though the initial abdominal CT was without any associated findings.  -Follow-up PSA  #FEN:  -Diet: Regular  #DVT prophylaxis: Lovenox  #CODE STATUS: FULL CODE -Cannot confirm given his suicidal ideation  Dispo: Disposition is deferred at this time, awaiting improvement of current medical problems.   The patient does not  have a current PCP (No primary care provider on file.) and does not know need an [facility] hospital follow-up appointment after discharge.  The patient does have transportation limitations that hinder transportation to clinic appointments.  .Services Needed at time of discharge: Y = Yes, Blank = No PT:   OT:   RN:   Equipment:   Other:     LOS: 2 days   Charlott Rakes, MD 05/16/2014, 1:22 PM

## 2014-05-16 NOTE — Clinical Documentation Improvement (Signed)
Supporting Information:  Patient with a history of CHF per 01/23 progress notes.   Possible Clinical Conditions: . Document acuity --Acute --Chronic --Acute on Chronic . Document type --Diastolic --Systolic --Combined systolic and diastolic . Due to or associated with --Cardiac or other surgery --Hypertension --Valvular disease --Rheumatic heart disease Endocarditis (valvitis) Pericarditis Myocarditis --Other (specify)    Thank Sherian Maroon Documentation Specialist [phone] [email or web address]

## 2014-05-16 NOTE — Care Management Note (Signed)
    Page 1 of 1   05/20/2014     4:41:07 PM CARE MANAGEMENT NOTE 05/20/2014  Patient:  Mitchell Molina, Mitchell Molina   Account Number:  [id number]  Date Initiated:  05/16/2014  Documentation initiated by:  Caelan Branden  Subjective/Objective Assessment:   Pt adm on 05/14/14 with hypokalemia, depression, UTI.  PTA, pt reports he is homeless.  He is on suicide precautions.     Action/Plan:   Psych MD recommend IP stay at [facility] facility. Psych CSW following to facilitate dc to IP facility when medically stable.   Anticipated DC Date:  05/20/2014   Anticipated DC Plan:  ASSISTED LIVING / REST HOME  In-house referral  Clinical Social Worker      DC Planning Services  CM consult      Choice offered to / List presented to:             Status of service:  Completed, signed off Medicare Important Message given?  NO (If response is "NO", the following Medicare IM given date fields will be blank) Date Medicare IM given:   Medicare IM given by:   Date Additional Medicare IM given:   Additional Medicare IM given by:    Discharge Disposition:  ASSISTED LIVING  Per UR Regulation:  Reviewed for med. necessity/level of care/duration of stay  If discussed at [place] of Stay Meetings, dates discussed:   05/19/2014    Comments:  05/20/14 Ellan Lambert, RN, BSN [phone] Pt cleared by psych for dc to ALF...plan dc to [facility]F today, per CSW arrangements.

## 2014-05-16 NOTE — Progress Notes (Signed)
Patient Name: Mitchell Molina Date of Encounter: 05/16/2014  Principal Problem:   Severe recurrent major depressive disorder with psychotic features Active Problems:   Hypokalemia   Chest pain   Suicidal ideation   Alcohol use disorder, moderate, dependence   Primary Cardiologist: [facility], new  Patient Profile: 74 yo male w/ reported hx CVA, CAD (details unclear). Hx COPD, DM, HTN was admitted 01/24 with chest pain, depression (psych admit planned after d/c from [facility]).  SUBJECTIVE: Pt c/o 8/10 chest pain, down from 10/10 on admission. Worse when he is aggravated (blood draw) and with deep inspiration or cough. Cannot provide further details on ?CAD, says gets meds from hospitals, last hospital was psych hospital in [place], 1 year ago. Says is on home O2, has mask at night (?CPAP) and uses inhaler.   OBJECTIVE Filed Vitals:   05/15/14 0507 05/15/14 1425 05/15/14 2018 05/16/14 0530  BP: 123/60 99/47 118/53 173/91  Pulse: 83 95 89 91  Temp: 97.9 F (36.6 C) 98.2 F (36.8 C) 98.1 F (36.7 C) 97.2 F (36.2 C)  TempSrc: Oral Oral Oral Oral  Resp: 18  18 16   Height:      Weight:    233 lb 14.5 oz (106.1 kg)  SpO2: 95% 94% 98% 99%    Intake/Output Summary (Last 24 hours) at 05/16/14 1022 Last data filed at 05/16/14 0900  Gross per 24 hour  Intake   1280 ml  Output   2125 ml  Net   -845 ml   Filed Weights   05/14/14 1122 05/14/14 1900 05/16/14 0530  Weight: 200 lb (90.719 kg) 229 lb 15 oz (104.3 kg) 233 lb 14.5 oz (106.1 kg)    PHYSICAL EXAM General: Well developed, well nourished, male in no acute distress. Head: Normocephalic, atraumatic.  Neck: Supple without bruits, JVD not elevated, difficult to assess secondary to body habitus. Lungs:  Resp regular and unlabored, few rales, no wheeze. Heart: RRR, S1, S2, no S3, S4, or murmur; no rub. Abdomen: Soft, non-tender, distended, BS + x 4.  Extremities: No clubbing, cyanosis, 1+ edema.  Neuro: Alert and oriented X 3.  Moves all extremities spontaneously. Psych: Anxious  LABS: CBC: Recent Labs  05/14/14 1142  WBC 6.1  NEUTROABS 5.0  HGB 10.8*  HCT 32.5*  MCV 83.5  PLT 158   INR: Recent Labs  05/14/14 1142  INR 5.63   Basic Metabolic Panel: Recent Labs  05/14/14 1142 05/14/14 2330 05/15/14 0200  NA 135  --  135  K 2.7*  --  4.5  CL 98  --  102  CO2 24  --  25  GLUCOSE 184*  --  211*  BUN <5*  --  <5*  CREATININE 0.71  --  0.78  CALCIUM 7.5*  --  7.5*  MG  --  1.6  --   PHOS  --  2.5  --    Liver Function Tests: Recent Labs  05/14/14 1142  AST 26  ALT 27  ALKPHOS 84  BILITOT 0.3  PROT 5.4*  ALBUMIN 3.0*   Cardiac Enzymes: Recent Labs  05/14/14 2316 05/15/14 0509  TROPONINI <0.03 <0.03    Recent Labs  05/14/14 1200  TROPIPOC 0.00   Hemoglobin A1C: Recent Labs  05/14/14 2330  HGBA1C 6.4*   TELE:  SR, ST      ECHO: 05/15/2014  Study Conclusions - Left ventricle: The cavity size was normal. Wall thickness was increased in a pattern of mild LVH. Systolic  function was vigorous. The estimated ejection fraction was in the range of 70% to 75%. Although no diagnostic regional wall motion abnormality was identified, this possibility cannot be completely excluded on the basis of this study. Doppler parameters are consistent with abnormal left ventricular relaxation (grade 1 diastolic dysfunction). Doppler parameters are consistent with elevated ventricular end-diastolic filling pressure. - Aortic valve: Mildly calcified annulus. Probably trileaflet. - Left atrium: The atrium was moderately dilated. - Tricuspid valve: There was trivial regurgitation. - Pulmonary arteries: Systolic pressure could not be accurately estimated. - Pericardium, extracardiac: A prominent pericardial fat pad was present. Cannot exclude pericardial effusion anteriorly with organization and small fluid component. Impressions: - Mild LVH with LVEF 70-75%, grade 1  diastolic dysfunction with increased filling pressures. Moderate left atrial enlargement. Trivial tricuspid regurgitation, unable to assess PASP. A prominent pericardial fat pad was present. Cannot exclude pericardial effusion anteriorly with organization and small fluid component.  Radiology/Studies: Ct Abdomen Pelvis Wo Contrast 05/14/2014   CLINICAL DATA:  Chest and mid abdominal pain. History of multiple medical problems including chronic obstructive pulmonary disease, hypertension, diabetes and coronary artery disease. Concern for pulmonary embolism.  EXAM: CT CHEST, ABDOMEN AND PELVIS WITHOUT CONTRAST  TECHNIQUE: Multidetector CT imaging of the chest, abdomen and pelvis was performed following the standard protocol without IV contrast.  COMPARISON:  None.  FINDINGS: CT CHEST FINDINGS  Study was ordered and initially attempted as a CTA. However, there was contrast extravasation from the IV in the right upper arm. 76 ml of Omnipaque 350 was infiltrated into the right upper arm soft tissues. I examined the patient and demonstrated no skin or pulse changes. Cold compresses were applied. A follow-up scout image of the right upper arm demonstrates the contrast dispersed throughout the medial soft tissues of the right upper arm. Contrast extravasation orders will be placed in the patient's chart. Noncontrast examinations of the chest, abdomen and pelvis for subsequently requested.  Mediastinum: There are no enlarged mediastinal or hilar lymph nodes. The thyroid gland and trachea demonstrate no significant findings. There is a small hiatal hernia. The heart size is normal. There is trace pericardial fluid versus thickening.Coronary artery calcifications are noted. There is mild atherosclerosis of the aorta and great vessels.  Lungs/Pleura: There is no pleural effusion.The lungs demonstrate moderate centrilobular and paraseptal emphysema. There is an irregular density within the lingula on axial  images 30 through 40. This has a linear configuration on the reformatted images and is most consistent with scarring. There is no confluent airspace opacity or suspicious pulmonary nodule.  Musculoskeletal/Chest wall: Bilateral gynecomastia noted. There are no worrisome osseous findings. There are bone islands and old rib fractures on the left.  CT ABDOMEN AND PELVIS FINDINGS  Hepatobiliary: As evaluated in the noncontrast state, the liver demonstrates no focal abnormality. No evidence of gallstones, gallbladder wall thickening or biliary dilatation.  Pancreas: Unremarkable. No pancreatic ductal dilatation or surrounding inflammatory changes.  Spleen: Normal in size without focal abnormality.  Adrenals/Urinary Tract: Both adrenal glands appear normal.There is a small amount of contrast material within the renal collecting systems bilaterally. There is no evidence of urinary tract calculus or hydronephrosis. There is no evidence of renal mass. A small amount of air is present within the urinary bladder which otherwise appears unremarkable.  Stomach/Bowel: No evidence of bowel wall thickening, distention or surrounding inflammatory change.The appendix appears normal. There are diverticular changes of the sigmoid colon. No extraluminal fluid collection or ascites demonstrated.  Vascular/Lymphatic: There are no enlarged abdominal or  pelvic lymph nodes. Mild aortoiliac atherosclerosis noted.  Reproductive: The prostate gland and seminal vesicles demonstrate no significant findings.  Other: No evidence of abdominal wall mass or hernia.  Musculoskeletal: No acute or significant osseous findings.  IMPRESSION: 1. No acute findings demonstrated within the chest, abdomen or pelvis on noncontrast imaging. 2. Attempted contrast-enhanced study aborted secondary to contrast extravasation into the right upper arm. Post extravasation orders are to be placed in the patient's chart. Elevation of the right arm, cold compresses and  close clinical observation recommended. 3. Probable scarring in the lingula. Although not morphologically very concerning for malignancy, given presumed risk factors for lung cancer, chest CT follow-up in 6 months suggested. 4. Atherosclerosis as described.   Electronically Signed   By: Camie Patience M.D.   On: 05/14/2014 15:58   Dg Chest 2 View 05/14/2014   CLINICAL DATA:  Cough and chest pain  EXAM: CHEST  2 VIEW  COMPARISON:  None.  FINDINGS: Cardiac shadow is within normal limits. The lungs are clear bilaterally. Old rib fractures with healing are noted on the right. No acute abnormality is seen.  IMPRESSION: No active cardiopulmonary disease.   Electronically Signed   By: Inez Catalina M.D.   On: 05/14/2014 12:24   US Abdomen Complete 05/16/2014   CLINICAL DATA:  Abdominal distention.  Hepatic cirrhosis  EXAM: ULTRASOUND ABDOMEN COMPLETE  COMPARISON:  May 14, 2014  FINDINGS: Gallbladder: No gallstones or wall thickening visualized. There is no pericholecystic fluid. No sonographic Murphy sign noted.  Common bile duct: Diameter: 8 mm, mildly prominent. No mass or calculus is appreciable in the biliary ductal system. Note that portions of the distal common bile duct are not well seen.  Liver: No focal lesion identified. Liver echogenicity is diffusely increased. Liver is enlarged, measuring approximately 21 cm in length.  IVC: No abnormality visualized.  Pancreas: Most of the pancreas is obscured by gas.  Spleen: Size and appearance within normal limits.  Right Kidney: Length: 13.2 cm. Echogenicity within normal limits. No mass or hydronephrosis visualized.  Left Kidney: Length: 13.8 cm. Echogenicity within normal limits. No mass or hydronephrosis visualized.  Abdominal aorta: No aneurysm visualized in visualized portions. Mid to distal aorta not well visualized due to overlying gas.  Other findings: No demonstrable ascites.  IMPRESSION: The liver echogenicity is increased, a finding that may be due to  underlying parenchymal liver disease and/or hepatic steatosis. While no focal liver lesions are identified, it must be cautioned that the sensitivity of ultrasound for focal liver lesions is diminished in this circumstance.  Pancreas nearly completely obscured by gas.  Portions of the mid and distal aorta not well visualized due to overlying gas. Visualized portions of the aorta are non- aneurysmal.  Common bile duct measures 8 mm, mildly prominent. No mass or calculus seen. Note, however, that the distal common bile duct is not well visualized due to overlying gas. If further imaging evaluation is felt to be warranted, MRCP could be helpful to further assess.   Electronically Signed   By: Lowella Grip M.D.   On: 05/16/2014 10:00    Current Medications:  . budesonide-formoterol  2 puff Inhalation BID  . diclofenac sodium  2 g Topical QID  . enoxaparin (LOVENOX) injection  40 mg Subcutaneous Q24H  . FLUoxetine  20 mg Oral Daily  . folic acid  1 mg Oral Daily  . lactulose  10 g Oral Daily  . levofloxacin  750 mg Oral Daily  . multivitamin with  minerals  1 tablet Oral Daily  . pantoprazole (PROTONIX) IV  40 mg Intravenous Q12H  . predniSONE  40 mg Oral Q breakfast  . sodium chloride  3 mL Intravenous Q12H  . thiamine  100 mg Oral Daily   Or  . thiamine  100 mg Intravenous Daily      ASSESSMENT AND PLAN: Principal Problem:   Severe recurrent major depressive disorder with psychotic features - for admit to inpatient psych when medically stable  Active Problems:   Hypokalemia - per IM, supp and improved    Chest pain - atypical, pt says continuous, IM notes say pt was pain-free yesterday at 1 point. Ez neg MI, echo w/ EF 70%, WMA cannot be excluded, grade 1 diast dysf. For MV today but pt unable to lie flat. Became SOB, believe multifactorial, 2nd to body habitus (large abdomen), underlying COPD, ?anxiety. No signs/sx respiratory problems, no volume overload on exam, no wheezing.     Suicidal ideation - per IM    Alcohol use disorder, moderate, dependence - per IM ,on CIWA protocol. Will give Ativan PRN for stress test.    ?Prostate CA - pt with frequent urination, prostate gland OK on CT, use condom cath for procedure.  Signed, Rosaria Ferries , PA-C 10:22 AM 05/16/2014  I have seen, examined and evaluated the patient this Am along with Ms. Ahmed Prima, A-C.  After reviewing all the available data and chart,  I agree with her findings, examination as well as impression recommendations.  Unclear history - he reports prior MI & CVA - but no documentation.  Normal EF. & no pulm edema on CXR.  Unable to lie flat for Myoview this AM -- I suspect this is mostly related to anxiety as he was able to lie flat for me.  Plan to pre-medicate with Ativan & retry Myoview this AM to exclude a cardiac etiology of CP.  Our team with f/u results.  Leonie Man, M.D., M.S. Interventional Cardiologist   Pager # [phone]

## 2014-05-17 DIAGNOSIS — J449 Chronic obstructive pulmonary disease, unspecified: Secondary | ICD-10-CM

## 2014-05-17 DIAGNOSIS — R079 Chest pain, unspecified: Secondary | ICD-10-CM | POA: Diagnosis not present

## 2014-05-17 DIAGNOSIS — R0789 Other chest pain: Secondary | ICD-10-CM | POA: Diagnosis not present

## 2014-05-17 DIAGNOSIS — R7309 Other abnormal glucose: Secondary | ICD-10-CM

## 2014-05-17 LAB — HIV ANTIBODY (ROUTINE TESTING W REFLEX): HIV 1/HIV 2 AB: NONREACTIVE

## 2014-05-17 LAB — PSA: PSA: 5.48 ng/mL — ABNORMAL HIGH (ref <=4.00)

## 2014-05-17 MED ORDER — OXYCODONE HCL 5 MG PO TABS
10.0000 mg | ORAL_TABLET | Freq: Four times a day (QID) | ORAL | Status: DC | PRN
  Filled 2014-05-17: qty 2

## 2014-05-17 MED ORDER — GUAIFENESIN 100 MG/5ML PO SYRP
200.0000 mg | ORAL_SOLUTION | ORAL | Status: DC | PRN
  Administered 2014-05-17: 200 mg via ORAL
  Filled 2014-05-17 (×2): qty 10

## 2014-05-17 MED ORDER — AMLODIPINE BESYLATE 5 MG PO TABS
5.0000 mg | ORAL_TABLET | Freq: Every day | ORAL | Status: DC
  Administered 2014-05-17 – 2014-05-21 (×5): 5 mg via ORAL
  Filled 2014-05-17 (×5): qty 1

## 2014-05-17 MED ORDER — OXYCODONE HCL 5 MG PO TABS
5.0000 mg | ORAL_TABLET | Freq: Once | ORAL | Status: AC
  Administered 2014-05-17: 5 mg via ORAL

## 2014-05-17 NOTE — Progress Notes (Addendum)
Subjective: This AM, he was lying in bed. He reported to me that he had pain in his head and lower back. He also reported not feeling well and depressed. We explained that it was reassuring he did not have a heart attack and that he would be clear for transfer to psychiatry once we could find him placement to which he was agreeable and appreciative.   Objective: Vital signs in last 24 hours: Filed Vitals:   05/16/14 1348 05/16/14 1438 05/17/14 0546 05/17/14 1102  BP: 157/93 149/88 153/82   Pulse:  77 68   Temp:  97.1 F (36.2 C) 97.9 F (36.6 C)   TempSrc:  Oral Oral   Resp:  20 18   Height:      Weight:   222 lb 14.4 oz (101.107 kg)   SpO2:  99% 98% 98%   Weight change: -11 lb 0.1 oz (-4.993 kg)  Intake/Output Summary (Last 24 hours) at 05/17/14 1231 Last data filed at 05/17/14 1208  Gross per 24 hour  Intake   1200 ml  Output   4426 ml  Net  -3226 ml   General: resting in bed, NAD, 2L O2 by Bradley HEENT: PERRL, EOMI, no scleral icterus,  Cardiac: RRR, no rubs, murmurs or gallops Pulm: clear to auscultation bilaterally without wheezing or crackles Abd: soft, distended with mild tenderness all over on palpation, BS present Ext: warm and well perfused, no pedal edema Neuro: responds to questions appropriately; moving all extremities freely  Lab Results: Basic Metabolic Panel:  Recent Labs Lab 05/14/14 1142 05/14/14 2330 05/15/14 0200  NA 135  --  135  K 2.7*  --  4.5  CL 98  --  102  CO2 24  --  25  GLUCOSE 184*  --  211*  BUN <5*  --  <5*  CREATININE 0.71  --  0.78  CALCIUM 7.5*  --  7.5*  MG  --  1.6  --   PHOS  --  2.5  --    Liver Function Tests:  Recent Labs Lab 05/14/14 1142  AST 26  ALT 27  ALKPHOS 84  BILITOT 0.3  PROT 5.4*  ALBUMIN 3.0*    Recent Labs Lab 05/14/14 1142  AMMONIA 68*   CBC:  Recent Labs Lab 05/14/14 1142  WBC 6.1  NEUTROABS 5.0  HGB 10.8*  HCT 32.5*  MCV 83.5  PLT 158   Cardiac Enzymes:  Recent Labs Lab  05/14/14 2316 05/15/14 0509 05/16/14 1049  TROPONINI <0.03 <0.03 <0.03   Coagulation:  Recent Labs Lab 05/14/14 1142  LABPROT 14.1  INR 1.07   Urine Drug Screen: Drugs of Abuse     Component Value Date/Time   LABOPIA NONE DETECTED 05/14/2014 1206   COCAINSCRNUR NONE DETECTED 05/14/2014 1206   LABBENZ POSITIVE* 05/14/2014 1206   AMPHETMU NONE DETECTED 05/14/2014 1206   THCU NONE DETECTED 05/14/2014 1206   LABBARB NONE DETECTED 05/14/2014 1206    Alcohol Level:  Recent Labs Lab 05/14/14 1142  ETH 112*   Urinalysis:  Recent Labs Lab 05/14/14 1206  COLORURINE YELLOW  LABSPEC 1.009  PHURINE 6.0  GLUCOSEU 100*  HGBUR SMALL*  BILIRUBINUR NEGATIVE  KETONESUR NEGATIVE  PROTEINUR NEGATIVE  UROBILINOGEN 1.0  NITRITE NEGATIVE  LEUKOCYTESUR NEGATIVE     Micro Results: Recent Results (from the past 240 hour(s))  MRSA PCR Screening     Status: None   Collection Time: 05/14/14  7:27 PM  Result Value Ref Range Status  MRSA by PCR NEGATIVE NEGATIVE Final    Comment:        The GeneXpert MRSA Assay (FDA approved for NASAL specimens only), is one component of a comprehensive MRSA colonization surveillance program. It is not intended to diagnose MRSA infection nor to guide or monitor treatment for MRSA infections.    Studies/Results: US Abdomen Complete  05/16/2014   CLINICAL DATA:  Abdominal distention.  Hepatic cirrhosis  EXAM: ULTRASOUND ABDOMEN COMPLETE  COMPARISON:  May 14, 2014  FINDINGS: Gallbladder: No gallstones or wall thickening visualized. There is no pericholecystic fluid. No sonographic Murphy sign noted.  Common bile duct: Diameter: 8 mm, mildly prominent. No mass or calculus is appreciable in the biliary ductal system. Note that portions of the distal common bile duct are not well seen.  Liver: No focal lesion identified. Liver echogenicity is diffusely increased. Liver is enlarged, measuring approximately 21 cm in length.  IVC: No abnormality  visualized.  Pancreas: Most of the pancreas is obscured by gas.  Spleen: Size and appearance within normal limits.  Right Kidney: Length: 13.2 cm. Echogenicity within normal limits. No mass or hydronephrosis visualized.  Left Kidney: Length: 13.8 cm. Echogenicity within normal limits. No mass or hydronephrosis visualized.  Abdominal aorta: No aneurysm visualized in visualized portions. Mid to distal aorta not well visualized due to overlying gas.  Other findings: No demonstrable ascites.  IMPRESSION: The liver echogenicity is increased, a finding that may be due to underlying parenchymal liver disease and/or hepatic steatosis. While no focal liver lesions are identified, it must be cautioned that the sensitivity of ultrasound for focal liver lesions is diminished in this circumstance.  Pancreas nearly completely obscured by gas.  Portions of the mid and distal aorta not well visualized due to overlying gas. Visualized portions of the aorta are non- aneurysmal.  Common bile duct measures 8 mm, mildly prominent. No mass or calculus seen. Note, however, that the distal common bile duct is not well visualized due to overlying gas. If further imaging evaluation is felt to be warranted, MRCP could be helpful to further assess.   Electronically Signed   By: Lowella Grip M.D.   On: 05/16/2014 10:00   Nm Myocar Multi W/spect W/wall Motion / Ef  05/16/2014   CLINICAL DATA:  Chest pain  EXAM: Lexiscan Myovue  TECHNIQUE: The patient received IV Lexiscan .4mg  over 15 seconds. 33.0 mCi of Technetium 28m Sestamibi injected at 30 seconds. Quantitative SPECT images were obtained in the vertical, horizontal and short axis planes after a 45 minute delay. Rest images were obtained with similar planes and delay using 10.2 mCi of Technetium 71m Sestamibi.  FINDINGS: ECG:  At rest: NSR, no ST changes, Stress:  No changes  Symptoms: none  RAW Data:  Significant extracardiac uptake and possible motion  Quantitiative Gated SPECT EF:  49%, but appears much better, no regional wall motion abnormalities.  Perfusion Images: There is decreased uptake in the distal inferior wall and in the true apex.  IMPRESSION: 1. Low risk pharmacologic perfusion study with normal LVEF, no regional wall motion abnormalities and a small size, moderate severity reversible defect in the distal inferior wall and in the true apex.  This study is severely affected by extracardiac uptake and possible patient motion. There is also uptake in the arm (spill or non-flushed iv line).  Ena Dawley   Electronically Signed   By: Ena Dawley   On: 05/16/2014 16:51   Medications: I have reviewed the patient's current medications. Scheduled  Meds: . amLODipine  5 mg Oral Daily  . budesonide-formoterol  2 puff Inhalation BID  . diclofenac sodium  2 g Topical QID  . enoxaparin (LOVENOX) injection  40 mg Subcutaneous Q24H  . FLUoxetine  20 mg Oral Daily  . folic acid  1 mg Oral Daily  . lactulose  10 g Oral Daily  . levofloxacin  750 mg Oral Daily  . multivitamin with minerals  1 tablet Oral Daily  . pantoprazole (PROTONIX) IV  40 mg Intravenous Q12H  . predniSONE  40 mg Oral Q breakfast  . sodium chloride  3 mL Intravenous Q12H  . thiamine  100 mg Oral Daily   Or  . thiamine  100 mg Intravenous Daily   Continuous Infusions:  PRN Meds:.guaifenesin, ipratropium-albuterol, LORazepam **OR** LORazepam, oxyCODONE, traZODone Assessment/Plan:  Atypical chest pain: Troponins have been negative x 3. Telemetry also unremarkable. Myoview yesterday with low-risk findings though defect noted in the inferior wall likely 2/2 his girth as echo would have shown wall motion abnormalities. Cardiology has signed off. Unclear of etiology though possibly alcoholic gastritis given that he did report some nausea/vomiting in the ED on admission though pain has resolved altogether. -Discontine telemetry today -Give Norvasc 5mg  for systolic BP trending [phone]  Elevated  ammonia: He appears back to baseline now and has no objective signs of liver disease other than some non-specific increased liver echogenicity. LFTs normal on admission.  -Consider discontinuing lactulose 10g -Continue reassessing mental status  Low back pain: Pain not adequately controlled on oxyIR 5mg  q6h prn. CIWA this AM was 0.  -Increase to oxyIR 10mg  q6h prn for low back pain   Pre-diabetes: A1c 6.4 on admission. -Advise lifestyle modification once acute illness resolves  Presumed COPD exacerbation: No PFTs on file though smoking history, symptoms, and prior home oxygen therapy are suggestive of chronic disease, especially given his productive cough. Lung exam findings stable from yesterday. -Continue Duonebs q6h prn -Give Symbicort 2 puffs BID -Give Levaquin 750mg  [Day 4/5] -Give prednisone 40mg  [Day 4/5] -Wean to room air as tolerated  Polysubstance abuse: CAGE 2/4 on admission along with 30-pack year smoking history. CIWA 2,5 overnight and now 0.  -Consider disontinuing CIWA  Anemia: Hb 10 on admission. Likely 2/2 chronic EtOH abuse as he has no active blood loss or signs of it. -Consider workup as outpatient  Homicidal & suicidal ideation: Recently hospitalized 3 months ago per Dr. Anders Simmonds and will require inpatient hospitalization per psychiatric assessment yesterday. He is otherwise cooperative and not agitated. -Continue Prozac 20mg  & trazodone 100mg  QHS prn for insomnia per Psych -Continue sitter and suicide precautions -Consider IVC if he decides to leave [facility] -Collect records from [facility] -If necessary, contact [phone] for recs for prior psych hospitalization  Possible prostate cancer: PSA 5.5 which is elevated and likely age-related as this value does increase with age. He reported being told he had prostate cancer by another provider and did so at his last prior psych hospitalization as well.  -Follow-up as outpatient  #FEN:  -Diet:  Regular  #DVT prophylaxis: Lovenox  #CODE STATUS: FULL CODE -Cannot confirm given his suicidal ideation  Dispo: Disposition is deferred at this time, awaiting improvement of current medical problems.   The patient does not have a current PCP (No primary care provider on file.) and does not know need an [facility] hospital follow-up appointment after discharge.  The patient does have transportation limitations that hinder transportation to clinic appointments.  .Services Needed at time of  discharge: Y = Yes, Blank = No PT:   OT:   RN:   Equipment:   Other:     LOS: 3 days   Charlott Rakes, MD 05/17/2014, 12:31 PM

## 2014-05-17 NOTE — Progress Notes (Signed)
   Myoview reviewed - Low Risk.  No Ischemia. Inferior defect may potentially be related to "prior MI" - but with no evidence of Ischemia. EF read as low - but was ~65-75% by Echo. No regional wall motion abnormality on Echo or Myoview - would argue against prior MI & more in favor of "gut/diaphragmatic attenuation".  No additional cardiology workup needed.  Would consider addressing HTN prior to transfer to [facility] - ACE-I/ARB or Amlodipine.  Will Sign off.  Leonie Man, M.D., M.S. Interventional Cardiologist   Pager # [phone]

## 2014-05-17 NOTE — Clinical Social Work Psych Note (Signed)
Psych CSW reviewed chart.  Patient has been seen by psychiatry 05/15/2014 [place], who recommends geri-psych admission once medically stable.  Patient will need to be re-evaluated by psychiatry once medically cleared to determine continued need for inpatient psychiatric placement.  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  Of report, patient is from an assisted living.    Per chart review, patient has not received medical clearance.  Please contact psych CSW once patient has been medically cleared for assistance with disposition.    Nonnie Done, [place] [phone]  Psychiatric & Orthopedics (5N 1-16) Clinical Social Worker

## 2014-05-17 NOTE — Progress Notes (Signed)
Responded to spiritual Care consult to pray with patient.  Patient said that he would like to have some reading glasses strength 325 and a bible. Bible provided.  I spoke with patients nurse regarding  Glasses request and SW has been notified to assist.  Patient said that he had pain in his lower back and head. Per nurse patient is homeless and very sad today. Patient is on suicide precaution with sitter.  Prayed for patient and will follow as needed and make unit chaplain aware of visit.   05/17/14 1200  Clinical Encounter Type  Visited With Patient;Health care provider  Visit Type Initial;Spiritual support  Referral From [place] text;Prayer;Emotional  Stress Factors  Patient Stress Factors Exhausted  Jaclynn Major, [place]

## 2014-05-17 NOTE — Progress Notes (Signed)
Internal Medicine Attending  Date: 05/17/2014  Patient name: Mitchell Molina Medical record number: [id number] Date of birth: June 19, 1940 Age: 74 y.o. Gender: male  I saw and evaluated the patient, and discussed his care with resident on A.M rounds.  I reviewed the resident's note by Dr. Posey Pronto and I agree with the resident's findings and plans as documented in his note, with the following additional comments.  We also discussed patient with cardiologist Dr. Ellyn Hack this morning.  Patient is medically clear for discharge to a psychiatric facility.  He will need to establish with a primary care physician for long-term management of his medical problems.

## 2014-05-17 NOTE — Discharge Summary (Addendum)
Name: Mitchell Molina MRN: [id number] DOB: [birth year] 74 y.o. PCP: No primary care provider on file.  Date of Admission: 05/14/2014 11:06 AM Date of Discharge: 05/21/2014 Attending Physician: Mitchell Filler, MD  Discharge Diagnosis: Atypical chest pain Elevated ammonia Pre-diabetes Elevated PSA Polysubstance abuse Low back pain Normocytic anemia Acute COPD exacerbation Homicidal & suicidal ideation   Discharge Medications:   Medication List    TAKE these medications        acetaminophen 500 MG tablet  Commonly known as:  TYLENOL  Take 1 tablet (500 mg total) by mouth every 6 (six) hours as needed for moderate pain.     albuterol-ipratropium 18-103 MCG/ACT inhaler  Commonly known as:  COMBIVENT  Inhale 1-2 puffs into the lungs every 6 (six) hours as needed for wheezing or shortness of breath (Use as rescue inhaler).     amLODipine 5 MG tablet  Commonly known as:  NORVASC  Take 1 tablet (5 mg total) by mouth daily.     budesonide-formoterol 160-4.5 MCG/ACT inhaler  Commonly known as:  SYMBICORT  Inhale 2 puffs into the lungs 2 (two) times daily.     diclofenac sodium 1 % Gel  Commonly known as:  VOLTAREN  Apply 2 g topically 4 (four) times daily.     FLUoxetine 20 MG capsule  Commonly known as:  PROZAC  Take 1 capsule (20 mg total) by mouth daily.     folic acid 1 MG tablet  Commonly known as:  FOLVITE  Take 1 tablet (1 mg total) by mouth daily.     lactulose 10 GM/15ML solution  Commonly known as:  CHRONULAC  Take 15 mLs (10 g total) by mouth daily as needed for mild constipation.     meloxicam 7.5 MG tablet  Commonly known as:  MOBIC  Take 1 tablet (7.5 mg total) by mouth daily with breakfast.     multivitamin with minerals Tabs tablet  Take 1 tablet by mouth daily.     pantoprazole 40 MG tablet  Commonly known as:  PROTONIX  Take 1 tablet (40 mg total) by mouth 2 (two) times daily before a meal.     traZODone 100 MG tablet  Commonly known  as:  DESYREL  Take 1 tablet (100 mg total) by mouth at bedtime as needed for sleep.        Disposition and follow-up:   Mitchell Molina was discharged from [facility] in Stable condition.  At the hospital follow up visit please address:  -Depression: outpatient psych follow-up -Pre-diabetes -Smoking/alcohol cessation  2.  Labs / imaging needed at time of follow-up:  Chest CT in 6 months Anemia panel  3.  Pending labs/ test needing follow-up: none  Follow-up Appointments:   Discharge Instructions: Discharge Instructions    Call MD for:  extreme fatigue    Complete by:  As directed      Call MD for:  persistant dizziness or light-headedness    Complete by:  As directed      Call MD for:  persistant nausea and vomiting    Complete by:  As directed      Call MD for:  severe uncontrolled pain    Complete by:  As directed      Call MD for:  temperature >100.4    Complete by:  As directed      Diet Carb Modified    Complete by:  As directed      Increase activity slowly  Complete by:  As directed            Consultations: Treatment Team:  Mitchell Parcel, MD  Procedures Performed:  Ct Abdomen Pelvis Wo Contrast  05/14/2014   CLINICAL DATA:  Chest and mid abdominal pain. History of multiple medical problems including chronic obstructive pulmonary disease, hypertension, diabetes and coronary artery disease. Concern for pulmonary embolism.  EXAM: CT CHEST, ABDOMEN AND PELVIS WITHOUT CONTRAST  TECHNIQUE: Multidetector CT imaging of the chest, abdomen and pelvis was performed following the standard protocol without IV contrast.  COMPARISON:  None.  FINDINGS: CT CHEST FINDINGS  Study was ordered and initially attempted as a CTA. However, there was contrast extravasation from the IV in the right upper arm. 76 ml of Omnipaque 350 was infiltrated into the right upper arm soft tissues. I examined the patient and demonstrated no skin or pulse changes.  Cold compresses were applied. A follow-up scout image of the right upper arm demonstrates the contrast dispersed throughout the medial soft tissues of the right upper arm. Contrast extravasation orders will be placed in the patient's chart. Noncontrast examinations of the chest, abdomen and pelvis for subsequently requested.  Mediastinum: There are no enlarged mediastinal or hilar lymph nodes. The thyroid gland and trachea demonstrate no significant findings. There is a small hiatal hernia. The heart size is normal. There is trace pericardial fluid versus thickening.Coronary artery calcifications are noted. There is mild atherosclerosis of the aorta and great vessels.  Lungs/Pleura: There is no pleural effusion.The lungs demonstrate moderate centrilobular and paraseptal emphysema. There is an irregular density within the lingula on axial images 30 through 40. This has a linear configuration on the reformatted images and is most consistent with scarring. There is no confluent airspace opacity or suspicious pulmonary nodule.  Musculoskeletal/Chest wall: Bilateral gynecomastia noted. There are no worrisome osseous findings. There are bone islands and old rib fractures on the left.  CT ABDOMEN AND PELVIS FINDINGS  Hepatobiliary: As evaluated in the noncontrast state, the liver demonstrates no focal abnormality. No evidence of gallstones, gallbladder wall thickening or biliary dilatation.  Pancreas: Unremarkable. No pancreatic ductal dilatation or surrounding inflammatory changes.  Spleen: Normal in size without focal abnormality.  Adrenals/Urinary Tract: Both adrenal glands appear normal.There is a small amount of contrast material within the renal collecting systems bilaterally. There is no evidence of urinary tract calculus or hydronephrosis. There is no evidence of renal mass. A small amount of air is present within the urinary bladder which otherwise appears unremarkable.  Stomach/Bowel: No evidence of bowel wall  thickening, distention or surrounding inflammatory change.The appendix appears normal. There are diverticular changes of the sigmoid colon. No extraluminal fluid collection or ascites demonstrated.  Vascular/Lymphatic: There are no enlarged abdominal or pelvic lymph nodes. Mild aortoiliac atherosclerosis noted.  Reproductive: The prostate gland and seminal vesicles demonstrate no significant findings.  Other: No evidence of abdominal wall mass or hernia.  Musculoskeletal: No acute or significant osseous findings.  IMPRESSION: 1. No acute findings demonstrated within the chest, abdomen or pelvis on noncontrast imaging. 2. Attempted contrast-enhanced study aborted secondary to contrast extravasation into the right upper arm. Post extravasation orders are to be placed in the patient's chart. Elevation of the right arm, cold compresses and close clinical observation recommended. 3. Probable scarring in the lingula. Although not morphologically very concerning for malignancy, given presumed risk factors for lung cancer, chest CT follow-up in 6 months suggested. 4. Atherosclerosis as described.   Electronically Signed   By: Rush Landmark  Lin Landsman M.D.   On: 05/14/2014 15:58   Dg Chest 2 View  05/14/2014   CLINICAL DATA:  Cough and chest pain  EXAM: CHEST  2 VIEW  COMPARISON:  None.  FINDINGS: Cardiac shadow is within normal limits. The lungs are clear bilaterally. Old rib fractures with healing are noted on the right. No acute abnormality is seen.  IMPRESSION: No active cardiopulmonary disease.   Electronically Signed   By: Inez Catalina M.D.   On: 05/14/2014 12:24   Ct Chest Wo Contrast  05/14/2014   CLINICAL DATA:  Chest and mid abdominal pain. History of multiple medical problems including chronic obstructive pulmonary disease, hypertension, diabetes and coronary artery disease. Concern for pulmonary embolism.  EXAM: CT CHEST, ABDOMEN AND PELVIS WITHOUT CONTRAST  TECHNIQUE: Multidetector CT imaging of the chest, abdomen and  pelvis was performed following the standard protocol without IV contrast.  COMPARISON:  None.  FINDINGS: CT CHEST FINDINGS  Study was ordered and initially attempted as a CTA. However, there was contrast extravasation from the IV in the right upper arm. 76 ml of Omnipaque 350 was infiltrated into the right upper arm soft tissues. I examined the patient and demonstrated no skin or pulse changes. Cold compresses were applied. A follow-up scout image of the right upper arm demonstrates the contrast dispersed throughout the medial soft tissues of the right upper arm. Contrast extravasation orders will be placed in the patient's chart. Noncontrast examinations of the chest, abdomen and pelvis for subsequently requested.  Mediastinum: There are no enlarged mediastinal or hilar lymph nodes. The thyroid gland and trachea demonstrate no significant findings. There is a small hiatal hernia. The heart size is normal. There is trace pericardial fluid versus thickening.Coronary artery calcifications are noted. There is mild atherosclerosis of the aorta and great vessels.  Lungs/Pleura: There is no pleural effusion.The lungs demonstrate moderate centrilobular and paraseptal emphysema. There is an irregular density within the lingula on axial images 30 through 40. This has a linear configuration on the reformatted images and is most consistent with scarring. There is no confluent airspace opacity or suspicious pulmonary nodule.  Musculoskeletal/Chest wall: Bilateral gynecomastia noted. There are no worrisome osseous findings. There are bone islands and old rib fractures on the left.  CT ABDOMEN AND PELVIS FINDINGS  Hepatobiliary: As evaluated in the noncontrast state, the liver demonstrates no focal abnormality. No evidence of gallstones, gallbladder wall thickening or biliary dilatation.  Pancreas: Unremarkable. No pancreatic ductal dilatation or surrounding inflammatory changes.  Spleen: Normal in size without focal abnormality.   Adrenals/Urinary Tract: Both adrenal glands appear normal.There is a small amount of contrast material within the renal collecting systems bilaterally. There is no evidence of urinary tract calculus or hydronephrosis. There is no evidence of renal mass. A small amount of air is present within the urinary bladder which otherwise appears unremarkable.  Stomach/Bowel: No evidence of bowel wall thickening, distention or surrounding inflammatory change.The appendix appears normal. There are diverticular changes of the sigmoid colon. No extraluminal fluid collection or ascites demonstrated.  Vascular/Lymphatic: There are no enlarged abdominal or pelvic lymph nodes. Mild aortoiliac atherosclerosis noted.  Reproductive: The prostate gland and seminal vesicles demonstrate no significant findings.  Other: No evidence of abdominal wall mass or hernia.  Musculoskeletal: No acute or significant osseous findings.  IMPRESSION: 1. No acute findings demonstrated within the chest, abdomen or pelvis on noncontrast imaging. 2. Attempted contrast-enhanced study aborted secondary to contrast extravasation into the right upper arm. Post extravasation orders are to be  placed in the patient's chart. Elevation of the right arm, cold compresses and close clinical observation recommended. 3. Probable scarring in the lingula. Although not morphologically very concerning for malignancy, given presumed risk factors for lung cancer, chest CT follow-up in 6 months suggested. 4. Atherosclerosis as described.   Electronically Signed   By: Camie Patience M.D.   On: 05/14/2014 15:58   US Abdomen Complete  05/16/2014   CLINICAL DATA:  Abdominal distention.  Hepatic cirrhosis  EXAM: ULTRASOUND ABDOMEN COMPLETE  COMPARISON:  May 14, 2014  FINDINGS: Gallbladder: No gallstones or wall thickening visualized. There is no pericholecystic fluid. No sonographic Murphy sign noted.  Common bile duct: Diameter: 8 mm, mildly prominent. No mass or calculus is  appreciable in the biliary ductal system. Note that portions of the distal common bile duct are not well seen.  Liver: No focal lesion identified. Liver echogenicity is diffusely increased. Liver is enlarged, measuring approximately 21 cm in length.  IVC: No abnormality visualized.  Pancreas: Most of the pancreas is obscured by gas.  Spleen: Size and appearance within normal limits.  Right Kidney: Length: 13.2 cm. Echogenicity within normal limits. No mass or hydronephrosis visualized.  Left Kidney: Length: 13.8 cm. Echogenicity within normal limits. No mass or hydronephrosis visualized.  Abdominal aorta: No aneurysm visualized in visualized portions. Mid to distal aorta not well visualized due to overlying gas.  Other findings: No demonstrable ascites.  IMPRESSION: The liver echogenicity is increased, a finding that may be due to underlying parenchymal liver disease and/or hepatic steatosis. While no focal liver lesions are identified, it must be cautioned that the sensitivity of ultrasound for focal liver lesions is diminished in this circumstance.  Pancreas nearly completely obscured by gas.  Portions of the mid and distal aorta not well visualized due to overlying gas. Visualized portions of the aorta are non- aneurysmal.  Common bile duct measures 8 mm, mildly prominent. No mass or calculus seen. Note, however, that the distal common bile duct is not well visualized due to overlying gas. If further imaging evaluation is felt to be warranted, MRCP could be helpful to further assess.   Electronically Signed   By: Lowella Grip M.D.   On: 05/16/2014 10:00   Nm Myocar Multi W/spect W/wall Motion / Ef  05/16/2014   CLINICAL DATA:  Chest pain  EXAM: Lexiscan Myovue  TECHNIQUE: The patient received IV Lexiscan .4mg  over 15 seconds. 33.0 mCi of Technetium 36m Sestamibi injected at 30 seconds. Quantitative SPECT images were obtained in the vertical, horizontal and short axis planes after a 45 minute delay. Rest  images were obtained with similar planes and delay using 10.2 mCi of Technetium 71m Sestamibi.  FINDINGS: ECG:  At rest: NSR, no ST changes, Stress:  No changes  Symptoms: none  RAW Data:  Significant extracardiac uptake and possible motion  Quantitiative Gated SPECT EF: 49%, but appears much better, no regional wall motion abnormalities.  Perfusion Images: There is decreased uptake in the distal inferior wall and in the true apex.  IMPRESSION: 1. Low risk pharmacologic perfusion study with normal LVEF, no regional wall motion abnormalities and a small size, moderate severity reversible defect in the distal inferior wall and in the true apex.  This study is severely affected by extracardiac uptake and possible patient motion. There is also uptake in the arm (spill or non-flushed iv line).  Ena Dawley   Electronically Signed   By: Ena Dawley   On: 05/16/2014 16:51  Admission HPI: Mr. Hiller is a 74 year old male with no prior medical history who presents to the ED with chest pain. At the time of interview, his blood alcohol level was 112.  This morning around 8-9AM, he reports drinking beer with his friends at a motel when we first noted the onset of intermittent chest pain. The pain started at the R lower chest and extended up to the mid-sternal area. He reports the pain improves with lying supine but worsens with deep inspiration and coughing. He also reports having two heart attacks about 5-6 years ago in [place], [place] which improved with the medications he received in the hospital.   He also reports a cough productive with "brown stuff " that has been ongoing for years though has worsened over the last several days. He has been on home oxygen of 2-3L in the past though not recently and was told by a doctor that he had bronchitis/emphysema. He has a 30-pack year smoking history and reports that a 12-pack of beer lasts him a couple days and has so for the last 15-20 years. He has considered the  need to cut down on drinking, felt guilty about drinking, and required a drink in the morning to get his day going. Of note, he expresses that he has thoughts of wanting to hurt himself and others and would do so if he had access to a firearm though currently does not. He denies other illicit and IV drug use.   In the ED, he was found to have K 2.7 and NH4 68. CT chest/abdomen was unremarkable for an acute process, and he was given Duonebs and lactulose.     Hospital Course by problem list:   Atypical chest pain: Troponins negative x 3 and stable EKG findings were reassuring to rule out ACS. 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 His pain resolved, and Cardiology recommended medication for his mildly elevated BP. He was started on Norvasc 5mg  and BP was normotensive at the time of discharge.  Elevated ammonia: LFTs on admission were unremarkable for hepatobiliary disease. Abdominal US was unremarkable for findings of ascites though noted non-specific increased signal in the liver. He was continued on lactulose 10g throughout his hospital stay with his mental status appearing intact at the time of discharge.   Pre-diabetes: A1c was found to be 6.4. He will need a PCP post-discharge to help him with lifestyle changes and metformin therapy.  Elevated PSA: Given his self-reported history of prostate cancer, PSA was found to be 5.5 though likely appropriate for his age. CT imaging on admission was unremarkable for metastatic disease.  Low back pain: He reported pain in his lower back and was started on Voltaren gel and oxyIR 5mg  as he reported intolerance of ASA and Tylenol [liver disease]. In the  setting of his polysubstance abuse, oxyIR was then discontinued in favor for Tylenol 500mg  q6h prn for pain once labwork and imaging were reassuring for no liver disease. Once his mental status improved, his intolerance to ASA was just some heartburn afterwards, so he was started on Mobic 7.5 mg QD and was tolerating it well at the time of discharge.  Polysubstance abuse: He was continued on  CIWA during his admission with his score at 0 for at least 24 hours prior ti discharge. Given his smoking history, he will need a chest CT in 6 months for lingular scarring concerning for possible malignancy.  Normocytic anemia: Hb was found to be 10 on admission and thought to be related in the setting of his chronic alcohol abuse. As he denied active signs of bleeding, his anemia was not worked up further and should be reassessed as an outpatient.  Acute COPD exacerbation: Wheezing improved with prednisone, Levaquin, Duonebs over the course of five days. He was also weaned off oxygen and was tolerating room air without increase work of breathing and continued on Symbicort 2 puffs BID as well as Combivent rescue inhaler.  Homicidal & suicidal ideation: He was maintained on suicide precautions along with a sitter in the room. Psychiatry was consulted and started Zoloft & trazodone. On hospital day 6 [05/19/14], it was no longer felt to be that he had active ideation as assessed by Psychiatry and that his ideation was more situational in nature. He will need follow-up with outpatient psychiatry.   Discharge Vitals:   BP 122/60 mmHg  Pulse 52  Temp(Src) 98 F (36.7 C) (Oral)  Resp 18  Ht 5\' 10"  (1.778 m)  Wt 220 lb 14.4 oz (100.2 kg)  BMI 31.70 kg/m2  SpO2 98%  Discharge Labs:  No results found for this or any previous visit (from the past 24 hour(s)).  Signed: Charlott Rakes, MD 05/24/2014, 10:19 AM    Services Ordered on Discharge: None Equipment Ordered on Discharge: None

## 2014-05-18 DIAGNOSIS — R079 Chest pain, unspecified: Secondary | ICD-10-CM | POA: Diagnosis not present

## 2014-05-18 DIAGNOSIS — R0789 Other chest pain: Secondary | ICD-10-CM | POA: Diagnosis not present

## 2014-05-18 MED ORDER — SENNOSIDES-DOCUSATE SODIUM 8.6-50 MG PO TABS
2.0000 | ORAL_TABLET | Freq: Two times a day (BID) | ORAL | Status: DC
  Administered 2014-05-18 – 2014-05-21 (×5): 2 via ORAL
  Filled 2014-05-18 (×8): qty 2

## 2014-05-18 MED ORDER — ACETAMINOPHEN 500 MG PO TABS: 500.0000 mg | ORAL_TABLET | Freq: Four times a day (QID) | ORAL | Status: DC | PRN

## 2014-05-18 MED ORDER — ACETAMINOPHEN 500 MG PO TABS
500.0000 mg | ORAL_TABLET | Freq: Four times a day (QID) | ORAL | Status: DC | PRN
  Administered 2014-05-18 – 2014-05-19 (×3): 500 mg via ORAL
  Filled 2014-05-18 (×3): qty 1

## 2014-05-18 MED ORDER — OXYCODONE HCL 5 MG PO TABS: 5.0000 mg | ORAL_TABLET | Freq: Four times a day (QID) | ORAL | Status: DC | PRN

## 2014-05-18 NOTE — Progress Notes (Signed)
Pt states "Get me the God dam self out of here, before I kill myself.'  Pt continue with sitter at bedside.  Pt expresses he wants to be d/c.  Informed pt that will f/u with Dr. Marland Kitchen SW to known about placement as they are working on getting him d/c.  Will continue to monitor.  Karie Kirks, Therapist, sports.

## 2014-05-18 NOTE — Progress Notes (Signed)
Internal Medicine Attending  Date: 05/18/2014  Patient name: Mitchell Molina Medical record number: [id number] Date of birth: 13-Jan-1941 Age: 74 y.o. Gender: male  I saw and evaluated the patient, and discussed his care with resident on A.M rounds.  I reviewed the resident's note by Dr. Posey Pronto and I agree with the resident's findings and plans as documented in his note, with the following additional comments.  Patient is doing well without acute problems.  He is medically stable for discharge to a psychiatric facility today.  We discussed with him the need to establish care with a primary care physician after his behavioral health hospitalization for follow-up of his chronic medical conditions including his hypertension, diet-controlled diabetes mellitus, and other issues.

## 2014-05-18 NOTE — Clinical Social Work Psych Note (Addendum)
Patient is awaiting psychiatric placement.  Psychiatry last evaluated on 1/24 Mitchell Cleaver, MD). Psych CSW requested follow-up from psychiatry today.  Psych CSW continuing to seek inpatient psychiatric placement.  Psych CSW referred the patient to the following facilities: [facility]- at [place]- at capacity- referral faxed [facility]- under review [facility]- no sponsorship beds available [facility]- referral faxed [facility]- at [place], [place] [phone]  Psychiatric & Orthopedics (5N 1-16) Clinical Social Worker

## 2014-05-18 NOTE — Progress Notes (Signed)
Subjective: This AM, he was sleeping on exam. He continues to report pain in his lower back. We explained to him that we are awaiting placement for him in a psych facility to which he is agreeable and appreciates our care for him.  Objective: Vital signs in last 24 hours: Filed Vitals:   05/17/14 2036 05/17/14 2047 05/18/14 0534 05/18/14 0919  BP: 113/63     Pulse: 70     Temp: 97.8 F (36.6 C)     TempSrc: Oral     Resp: 18     Height:      Weight:   228 lb 1.7 oz (103.467 kg)   SpO2: 97% 97%  97%   Weight change: 5 lb 3.3 oz (2.36 kg)  Intake/Output Summary (Last 24 hours) at 05/18/14 1241 Last data filed at 05/18/14 0600  Gross per 24 hour  Intake   1080 ml  Output   1900 ml  Net   -820 ml   General: resting in bed, NAD, 2L O2 by [place] HEENT: PERRL, EOMI, no scleral icterus,  Cardiac: RRR, no rubs, murmurs or gallops Pulm: clear to auscultation bilaterally without wheezing or crackles Abd: soft, distended with mild tenderness all over on palpation, BS present Ext: warm and well perfused, no pedal edema Neuro: responds to questions appropriately; moving all extremities freely  Lab Results: Basic Metabolic Panel:  Recent Labs Lab 05/14/14 1142 05/14/14 2330 05/15/14 0200  NA 135  --  135  K 2.7*  --  4.5  CL 98  --  102  CO2 24  --  25  GLUCOSE 184*  --  211*  BUN <5*  --  <5*  CREATININE 0.71  --  0.78  CALCIUM 7.5*  --  7.5*  MG  --  1.6  --   PHOS  --  2.5  --    Liver Function Tests:  Recent Labs Lab 05/14/14 1142  AST 26  ALT 27  ALKPHOS 84  BILITOT 0.3  PROT 5.4*  ALBUMIN 3.0*    Recent Labs Lab 05/14/14 1142  AMMONIA 68*   CBC:  Recent Labs Lab 05/14/14 1142  WBC 6.1  NEUTROABS 5.0  HGB 10.8*  HCT 32.5*  MCV 83.5  PLT 158   Cardiac Enzymes:  Recent Labs Lab 05/14/14 2316 05/15/14 0509 05/16/14 1049  TROPONINI <0.03 <0.03 <0.03   Coagulation:  Recent Labs Lab 05/14/14 1142  LABPROT 14.1  INR 1.07   Urine Drug  Screen: Drugs of Abuse     Component Value Date/Time   LABOPIA NONE DETECTED 05/14/2014 1206   COCAINSCRNUR NONE DETECTED 05/14/2014 1206   LABBENZ POSITIVE* 05/14/2014 1206   AMPHETMU NONE DETECTED 05/14/2014 1206   THCU NONE DETECTED 05/14/2014 1206   LABBARB NONE DETECTED 05/14/2014 1206    Alcohol Level:  Recent Labs Lab 05/14/14 1142  ETH 112*   Urinalysis:  Recent Labs Lab 05/14/14 1206  COLORURINE YELLOW  LABSPEC 1.009  PHURINE 6.0  GLUCOSEU 100*  HGBUR SMALL*  BILIRUBINUR NEGATIVE  KETONESUR NEGATIVE  PROTEINUR NEGATIVE  UROBILINOGEN 1.0  NITRITE NEGATIVE  LEUKOCYTESUR NEGATIVE     Micro Results: Recent Results (from the past 240 hour(s))  MRSA PCR Screening     Status: None   Collection Time: 05/14/14  7:27 PM  Result Value Ref Range Status   MRSA by PCR NEGATIVE NEGATIVE Final    Comment:        The GeneXpert MRSA Assay (FDA approved for NASAL specimens  only), is one component of a comprehensive MRSA colonization surveillance program. It is not intended to diagnose MRSA infection nor to guide or monitor treatment for MRSA infections.    Studies/Results: Nm Myocar Multi W/spect W/wall Motion / Ef  05/16/2014   CLINICAL DATA:  Chest pain  EXAM: Lexiscan Myovue  TECHNIQUE: The patient received IV Lexiscan .4mg  over 15 seconds. 33.0 mCi of Technetium 77m Sestamibi injected at 30 seconds. Quantitative SPECT images were obtained in the vertical, horizontal and short axis planes after a 45 minute delay. Rest images were obtained with similar planes and delay using 10.2 mCi of Technetium 4m Sestamibi.  FINDINGS: ECG:  At rest: NSR, no ST changes, Stress:  No changes  Symptoms: none  RAW Data:  Significant extracardiac uptake and possible motion  Quantitiative Gated SPECT EF: 49%, but appears much better, no regional wall motion abnormalities.  Perfusion Images: There is decreased uptake in the distal inferior wall and in the true apex.  IMPRESSION: 1. Low  risk pharmacologic perfusion study with normal LVEF, no regional wall motion abnormalities and a small size, moderate severity reversible defect in the distal inferior wall and in the true apex.  This study is severely affected by extracardiac uptake and possible patient motion. There is also uptake in the arm (spill or non-flushed iv line).  Ena Dawley   Electronically Signed   By: Ena Dawley   On: 05/16/2014 16:51   Medications: I have reviewed the patient's current medications. Scheduled Meds: . amLODipine  5 mg Oral Daily  . budesonide-formoterol  2 puff Inhalation BID  . diclofenac sodium  2 g Topical QID  . enoxaparin (LOVENOX) injection  40 mg Subcutaneous Q24H  . FLUoxetine  20 mg Oral Daily  . folic acid  1 mg Oral Daily  . lactulose  10 g Oral Daily  . levofloxacin  750 mg Oral Daily  . multivitamin with minerals  1 tablet Oral Daily  . pantoprazole (PROTONIX) IV  40 mg Intravenous Q12H  . predniSONE  40 mg Oral Q breakfast  . senna-docusate  2 tablet Oral BID  . sodium chloride  3 mL Intravenous Q12H  . thiamine  100 mg Oral Daily   Or  . thiamine  100 mg Intravenous Daily   Continuous Infusions:  PRN Meds:.guaifenesin, ipratropium-albuterol, oxyCODONE, traZODone Assessment/Plan:  Homicidal & suicidal ideation: Recently hospitalized 3 months ago per Dr. Anders Simmonds and will require inpatient hospitalization per psychiatric assessment. He becomes intermittently uncooperative but is otherwise pleasant on exam. -Continue Prozac 20mg  & trazodone 100mg  QHS prn for insomnia per Psych -Continue sitter and suicide precautions -Consider IVC if he decides to leave AMA -Attempt to collect records from [facility] again today [phone] -If necessary, contact [phone] for recs for prior psych hospitalization  Low back pain: He did not request oxyIR 10mg  yesterday at all for back pain. Given that he will need a PCP to prescribe this medication long-term, opiate  therapy is not advisable. -Resume Tylenol 500mg  q6h prn for pain as he does not have objective findings for liver disease   Elevated ammonia: He appears back to baseline now and has no objective signs of liver disease other than some non-specific increased liver echogenicity. LFTs normal on admission.  -Consider discontinuing lactulose 10g -Continue reassessing mental status  HTN: BP 112/63 on Norvasc 5mg . Continue current therapy.  Pre-diabetes: A1c 6.4 on admission. -Advise lifestyle modification once acute illness resolves -Consider metformin once PCP follow-up is established  Presumed COPD exacerbation: No  PFTs on file though smoking history, symptoms, and prior home oxygen therapy are suggestive of chronic disease, especially given his productive cough. Lung exam findings stable from yesterday and now off oxygen. -Continue Duonebs q6h prn -Give Symbicort 2 puffs BID -Give Levaquin 750mg  [Day 5/5] -Give prednisone 40mg  [place] 5/5]  Polysubstance abuse: CAGE 2/4 on admission along with 30-pack year smoking history. CIWA 0 for 24 hours.  -Consider nicotine patch if he is asking for cigarettes  Anemia: Hb 10 on admission. Likely 2/2 chronic EtOH abuse as he has no active blood loss or signs of it. -Consider workup as outpatient  Possible prostate cancer: PSA 5.5 which is elevated and likely age-related as this value does increase with age. He reported being told he had prostate cancer by another provider and did so at his last prior psych hospitalization as well.  -Follow-up as outpatient  #FEN:  -Diet: Regular  #DVT prophylaxis: Lovenox  #CODE STATUS: FULL CODE -Cannot confirm given his suicidal ideation  Dispo: Disposition is deferred at this time, awaiting improvement of current medical problems. Pending psych hospital placement.  The patient does not have a current PCP (No primary care provider on file.) and does not know need an [facility] hospital follow-up appointment after  discharge.  The patient does have transportation limitations that hinder transportation to clinic appointments.  .Services Needed at time of discharge: Y = Yes, Blank = No PT:   OT:   RN:   Equipment:   Other:     LOS: 4 days   Charlott Rakes, MD 05/18/2014, 12:41 PM

## 2014-05-19 DIAGNOSIS — R079 Chest pain, unspecified: Secondary | ICD-10-CM | POA: Diagnosis not present

## 2014-05-19 DIAGNOSIS — R0789 Other chest pain: Secondary | ICD-10-CM | POA: Diagnosis not present

## 2014-05-19 DIAGNOSIS — F333 Major depressive disorder, recurrent, severe with psychotic symptoms: Secondary | ICD-10-CM | POA: Diagnosis not present

## 2014-05-19 MED ORDER — PANTOPRAZOLE SODIUM 40 MG PO TBEC
40.0000 mg | DELAYED_RELEASE_TABLET | Freq: Two times a day (BID) | ORAL | Status: DC
  Administered 2014-05-19 – 2014-05-20 (×3): 40 mg via ORAL
  Filled 2014-05-19 (×4): qty 1

## 2014-05-19 MED ORDER — BISACODYL 10 MG RE SUPP
10.0000 mg | Freq: Every day | RECTAL | Status: DC | PRN
  Filled 2014-05-19: qty 1

## 2014-05-19 MED ORDER — MELOXICAM 7.5 MG PO TABS
7.5000 mg | ORAL_TABLET | Freq: Every day | ORAL | Status: DC
  Administered 2014-05-19 – 2014-05-20 (×2): 7.5 mg via ORAL
  Filled 2014-05-19 (×4): qty 1

## 2014-05-19 NOTE — Evaluation (Signed)
Physical Therapy Evaluation Patient Details Name: Mitchell Molina MRN: [id number] DOB: 20-Aug-1940 Today's Date: 05/19/2014   History of Present Illness  Admitted with Chest pain, cough, thoughts of hurting himself and others. Pertinent NID:POEU, low back pain, substance abuse   Clinical Impression  Pt admitted with above diagnosis. Pt currently with functional limitations due to the deficits listed below (see PT Problem List). Pt will benefit from skilled PT to increase their independence and safety with mobility to allow discharge to ALF.  Pt would eventually like to get an apartment again and he can likely reach a high enough functional status to allow that depending on the progression of his medical issues. Per a standardized balance test he should use an assistive device with mobility to decrease risk of falls-a 4 wheeled walker with a seat was recommended due to his COPD.      Follow Up Recommendations Home health PT;Supervision - Intermittent    Equipment Recommendations  Other (comment) (4 wheeled RW with seat due to COPD and fatigues with gait)    Recommendations for Other Services       Precautions / Restrictions Precautions Precautions: Fall;Other (comment) (Currently has a sitter)      Mobility  Bed Mobility Overal bed mobility: Modified Independent (Used rail)                Transfers Overall transfer level: Needs assistance Equipment used: Rolling walker (2 wheeled) Transfers: Sit to/from Stand Sit to Stand: Supervision         General transfer comment: Cues for technique. Able to stand without using UEs  Ambulation/Gait Ambulation/Gait assistance: Supervision Ambulation Distance (Feet): 200 Feet Assistive device: Rolling walker (2 wheeled) Gait Pattern/deviations: Step-through pattern;Decreased stride length     General Gait Details: Attempted gait without RW. Pt much slower and reports feeling unsteady with RW. No balance losses with  gait.  Stairs            Wheelchair Mobility    Modified Rankin (Stroke Patients Only)       Balance Overall balance assessment: History of Falls;Modified Independent                               Standardized Balance Assessment Standardized Balance Assessment : Berg Balance Test Berg Balance Test Sit to Stand: Able to stand without using hands and stabilize independently Standing Unsupported: Able to stand safely 2 minutes Sitting with Back Unsupported but Feet Supported on Floor or Stool: Able to sit safely and securely 2 minutes Stand to Sit: Sits safely with minimal use of hands Transfers: Able to transfer safely, minor use of hands Standing Unsupported with Eyes Closed: Able to stand 10 seconds safely Standing Ubsupported with Feet Together: Able to place feet together independently and stand for 1 minute with supervision From Standing, Reach Forward with Outstretched Arm: Can reach confidently >25 cm (10") From Standing Position, Pick up Object from Floor: Unable to pick up shoe, but reaches 2-5 cm (1-2") from shoe and balances independently From Standing Position, Turn to Look Behind Over each Shoulder: Looks behind from both sides and weight shifts well Turn 360 Degrees: Able to turn 360 degrees safely one side only in 4 seconds or less Standing Unsupported, Alternately Place Feet on Step/Stool: Able to complete >2 steps/needs minimal assist Standing Unsupported, One Foot in Front: Able to take small step independently and hold 30 seconds Standing on One Leg: Able to lift leg independently  and hold 5-10 seconds Total Score: 46         Pertinent Vitals/Pain Pain Assessment: 0-10 Pain Score:  (Did not give a pain score, but did not limit therapy) Pain Location: low back and left knee Pain Descriptors / Indicators: Aching Pain Intervention(s): Limited activity within patient's tolerance  Oxygen Sats 97% on room air after ambulating    Home Living  Family/patient expects to be discharged to:: Assisted living               Home Equipment: None (Had used RW in the past)      Prior Function Level of Independence: Independent (Difficulty with tying shoes, used RW in the past )               Hand Dominance        Extremity/Trunk Assessment   Upper Extremity Assessment: Overall WFL for tasks assessed           Lower Extremity Assessment: Overall WFL for tasks assessed      Cervical / Trunk Assessment: Normal  Communication   Communication: No difficulties  Cognition Arousal/Alertness: Awake/alert Behavior During Therapy: WFL for tasks assessed/performed Overall Cognitive Status: Within Functional Limits for tasks assessed                      General Comments General comments (skin integrity, edema, etc.): Pt pleasant and cooperative throughout exam. Concerned about his lost glasses    Exercises        Assessment/Plan    PT Assessment Patient needs continued PT services  PT Diagnosis Difficulty walking   PT Problem List Decreased activity tolerance;Decreased balance;Decreased mobility;Decreased knowledge of use of DME;Cardiopulmonary status limiting activity  PT Treatment Interventions DME instruction;Gait training;Functional mobility training;Therapeutic activities;Balance training;Patient/family education   PT Goals (Current goals can be found in the Care Plan section) Acute Rehab PT Goals Patient Stated Goal: To get an apartment again PT Goal Formulation: With patient Time For Goal Achievement: 05/26/14 Potential to Achieve Goals: Good    Frequency Min 3X/week   Barriers to discharge Decreased caregiver support      Co-evaluation               End of Session Equipment Utilized During Treatment: Gait belt Activity Tolerance: Patient tolerated treatment well Patient left: in chair;with call bell/phone within reach;with nursing/sitter in room [employer] present, no phone in  room) Nurse Communication: Other (comment) (Discussed DC needs with [place] from SW)         Time: [phone] PT Time Calculation (min) (ACUTE ONLY): 25 min   Charges:   PT Evaluation $Initial PT Evaluation Tier I: 1 Procedure PT Treatments $Gait Training: 8-22 mins   PT G CodesMelvern Banker 05/19/2014, 2:35 PM  Lavonia Dana, [place] 05/19/2014

## 2014-05-19 NOTE — Progress Notes (Signed)
Informed by Dr. Posey Pronto that suicide sitter may be d/c'd .  Sitter made aware and pt made aware and instructed to call for assistance,.  Verbalized understanding.   Call bell at his bedside. And bed alarm on.  Will continue to monitor.  Robbi Scurlock,RN.
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
## 2014-05-19 NOTE — Consult Note (Signed)
Psychiatry Consult follow-up note  Reason for Consult: depression and suicidal thoughts Referring Physician:  Dr. Posey Pronto Patient Identification: Mitchell Molina MRN:  [id number] Principal Diagnosis: Severe recurrent major depressive disorder with psychotic features Diagnosis:   Patient Active Problem List   Diagnosis Date Noted  . Severe recurrent major depressive disorder with psychotic features [F33.3] 05/15/2014  . Alcohol use disorder, moderate, dependence [F10.20] 05/15/2014  . Chest pain [R07.9]   . Suicidal ideation [R45.851]   . Hypokalemia [E87.6] 05/14/2014    Total Time spent with patient: 30 minutes  Subjective:   Mitchell Molina is a 74 y.o. male patient admitted with chest pain and suicidal thoughts.  HPI: Mitchell Molina is a 74 y.o. male with history of major depression, alcoholism, cirrhosis, COPD who presents to the hospital with chest pain, depression, suicidal ideation, abdominal pain, vomiting. Patient reports prior history of treatment for depression, currently he is reporting worsening depressive symptoms such as low energy level, lack of motivation, poor concentration, insomnia, feeling hopeless, helpless and suicidal thoughts with no plan. Patient also endorses hearing voices telling him that people are going to kill him, also feels that people are out to get him. Patient reports that he is currently homeless. He is not on medications for his depression but has been drinking alcohol to self medicate. Patient reports long history of drinking, drinks about 3-4 days per week when he has money and reports history of black out and tremors. He denies history of drug use but smokes at least a pack of cigarette daily. Patient States that he has cirrhosis from the alcohol and has been vomiting from that. He hasn't been seeing a doctor for years. Also states that he has prostate cancer but is not currently getting treated. He is requesting to get help for the treatment of his  depression.  Interval note: Patient reported he is to have her apartment in [place], [place] which which was too much to pay for him so he left and then staying here today with her friends and family members and currently considered himself is homeless. Patient also reported he has been drinking alcohol by borrowing money from here and there. Patient denies current symptoms of alcohol withdrawal. Patient continued to report depressed secondary to psychosocial situation. Patient has been compliant with his medication and has no reported side effects. Patient requested out-of-home placement like assisted living facility where he can have stable and structured environment and able to receive his disability benefits. Reportedly could not get his disability benefits because is not depressed for sometime about a year or so. Case discussed with Nonnie Done, LCSW. Marland Kitchen   Past Medical History:  Past Medical History  Diagnosis Date  . COPD (chronic obstructive pulmonary disease)   . Diabetes mellitus without complication   . Hypertension   . Coronary artery disease   . Arthritis   . Asthma   . Cancer   . CHF (congestive heart failure)   . Seizures   . Stroke   . Renal disorder   . Cirrhosis     Past Surgical History  Procedure Laterality Date  . Hernia repair    . Hemorrhoid surgery     Family History:  Family History  Problem Relation Age of Onset  . CVA Father   . Colon cancer Mother    Social History:  History  Alcohol Use  . 0.0 oz/week  . 0 Not specified per week    Comment: daily; liquor or wine;  last drink yesterday     History  Drug Use No    History   Social History  . Marital Status: Legally Separated    Spouse Name: N/A    Number of Children: 0  . Years of Education: N/A   Social History Main Topics  . Smoking status: Current Some Day Smoker  . Smokeless tobacco: None     Comment: He says that he quit two days ago.   . Alcohol Use: 0.0 oz/week    0 Not  specified per week     Comment: daily; liquor or wine; last drink yesterday  . Drug Use: No  . Sexual Activity: None   Other Topics Concern  . None   Social History Narrative   Lives alone.    Additional Social History:                          Allergies:   Allergies  Allergen Reactions  . Asa [Aspirin] Other (See Comments)    States he doesn't know, but he will not take it  . Tylenol [Acetaminophen] Other (See Comments)    Can not take due to Liver issues    Vitals: Blood pressure 107/48, pulse 70, temperature 98.9 F (37.2 C), temperature source Oral, resp. rate 18, height 5\' 10"  (1.778 m), weight 102.8 kg (226 lb 10.1 oz), SpO2 96 %.  Risk to Self: Is patient at risk for suicide?: Yes Risk to Others:   Prior Inpatient Therapy:   Prior Outpatient Therapy:    Current Facility-Administered Medications  Medication Dose Route Frequency Provider Last Rate Last Dose  . acetaminophen (TYLENOL) tablet 500 mg  500 mg Oral Q6H PRN Axel Filler, MD   500 mg at 05/19/14 0915  . amLODipine (NORVASC) tablet 5 mg  5 mg Oral Daily Charlott Rakes, MD   5 mg at 05/19/14 0919  . bisacodyl (DULCOLAX) suppository 10 mg  10 mg Rectal Daily PRN Charlott Rakes, MD   10 mg at 05/19/14 1439  . budesonide-formoterol (SYMBICORT) 160-4.5 MCG/ACT inhaler 2 puff  2 puff Inhalation BID Bethena Roys, MD   2 puff at 05/19/14 0856  . diclofenac sodium (VOLTAREN) 1 % transdermal gel 2 g  2 g Topical QID Ejiroghene E Emokpae, MD   2 g at 05/19/14 1440  . FLUoxetine (PROZAC) capsule 20 mg  20 mg Oral Daily Mojeed Akintayo   20 mg at 05/19/14 0918  . folic acid (FOLVITE) tablet 1 mg  1 mg Oral Daily Ejiroghene Arlyce Dice, MD   1 mg at 05/19/14 0915  . guaifenesin (ROBITUSSIN) 100 MG/5ML syrup 200 mg  200 mg Oral Q4H PRN Charlott Rakes, MD   200 mg at 05/17/14 1036  . ipratropium-albuterol (DUONEB) 0.5-2.5 (3) MG/3ML nebulizer solution 3 mL  3 mL Nebulization Q6H PRN Ejiroghene E Emokpae, MD       . lactulose (CHRONULAC) 10 GM/15ML solution 10 g  10 g Oral Daily Ejiroghene Arlyce Dice, MD   10 g at 05/19/14 0916  . meloxicam (MOBIC) tablet 7.5 mg  7.5 mg Oral Q breakfast Charlott Rakes, MD   7.5 mg at 05/19/14 1439  . multivitamin with minerals tablet 1 tablet  1 tablet Oral Daily Ejiroghene Arlyce Dice, MD   1 tablet at 05/19/14 0916  . pantoprazole (PROTONIX) EC tablet 40 mg  40 mg Oral BID AC Charlott Rakes, MD   40 mg at 05/19/14 1438  . predniSONE (DELTASONE) tablet  40 mg  40 mg Oral Q breakfast Charlott Rakes, MD   40 mg at 05/19/14 0916  . senna-docusate (Senokot-S) tablet 2 tablet  2 tablet Oral BID Charlott Rakes, MD   2 tablet at 05/19/14 0917  . sodium chloride 0.9 % injection 3 mL  3 mL Intravenous Q12H Ejiroghene Arlyce Dice, MD   3 mL at 05/19/14 0917  . thiamine (VITAMIN B-1) tablet 100 mg  100 mg Oral Daily Ejiroghene E Denton Brick, MD   100 mg at 05/19/14 8119   Or  . thiamine (B-1) injection 100 mg  100 mg Intravenous Daily Ejiroghene E Denton Brick, MD      . traZODone (DESYREL) tablet 100 mg  100 mg Oral QHS PRN Mojeed Akintayo   100 mg at 05/18/14 2140    Musculoskeletal: Strength & Muscle Tone: abnormal Gait & Station: unsteady Patient leans: forward  Psychiatric Specialty Exam: Physical Exam  Review of Systems  Constitutional: Positive for malaise/fatigue.  Eyes: Negative.   Respiratory: Negative.   Gastrointestinal: Positive for nausea.  Genitourinary: Negative.   Musculoskeletal: Positive for myalgias.  Skin: Negative.   Neurological: Positive for weakness.  Endo/Heme/Allergies: Negative.   Psychiatric/Behavioral: Positive for depression, suicidal ideas, hallucinations and substance abuse. The patient is nervous/anxious and has insomnia.     Blood pressure 107/48, pulse 70, temperature 98.9 F (37.2 C), temperature source Oral, resp. rate 18, height 5\' 10"  (1.778 m), weight 102.8 kg (226 lb 10.1 oz), SpO2 96 %.Body mass index is 32.52 kg/(m^2).  General Appearance: Casual   Eye Contact::  Good  Speech:  Clear and Coherent  Volume:  Normal  Mood:  Depressed and Dysphoric  Affect:  Constricted  Thought Process:  Goal Directed  Orientation:  Full (Time, Place, and Person)  Thought Content:  Delusions and Hallucinations: Auditory  Suicidal Thoughts:  No  Homicidal Thoughts:  No  Memory:  Immediate;   Fair Recent;   Fair Remote;   Fair  Judgement:  Impaired  Insight:  Shallow  Psychomotor Activity:  Decreased  Concentration:  Fair  Recall:  [employer] of Knowledge:Good  Language: Good  Akathisia:  No  Handed:  Right  AIMS (if indicated):     Assets:  Communication Skills Desire for Improvement  ADL's:  Intact  Cognition: WNL  Sleep:   poor   Medical Decision Making: Established Problem, Worsening (2), Review or order clinical lab tests (1) Review of medication regiment & side effects (2) Review of new medications or change in dosage (2)  Treatment Plan Summary: Daily contact with patient to assess and evaluate symptoms and progress in treatment. Medication management: Continue Prozac 20mg  daily for depression. Continue Trazodone 100mg  qhs PRN for insomnia  Plan:   Patient does not meet criteria for acute psychiatric hospitalization any longer  Recommend outpatient psychiatric services and medically cleared   Disposition:  Psychiatric social service will follow up with the patient regarding possible assisted living facilities and required psychosocial support.  Durward Parcel., MD 05/19/2014 3:38 PM

## 2014-05-19 NOTE — Progress Notes (Addendum)
Subjective: Per RN Nellie, he became agitated overnight and security was called to calm him down. Since then, he has been calm.  This AM, he was awake on exam. He reports pain in his abdomen and knees. He reports not having a BM yesterday. Otherwise, he awaits placement.  Early this afternoon, SW Nonnie Done spoke to me that she and Dr. Louretta Shorten evaluated him and felt he longer had active suicidal/homicidal ideation and thus no longer met criteria for inpatient psych hospitalization.   Objective: Vital signs in last 24 hours: Filed Vitals:   05/19/14 0508 05/19/14 0856 05/19/14 0919 05/19/14 1530  BP:   99/48 107/48  Pulse:    70  Temp:    98.9 F (37.2 C)  TempSrc:    Oral  Resp:    18  Height:      Weight: 226 lb 10.1 oz (102.8 kg)     SpO2:  97%  96%   Weight change: -1 lb 7.5 oz (-0.667 kg)  Intake/Output Summary (Last 24 hours) at 05/19/14 1553 Last data filed at 05/19/14 1530  Gross per 24 hour  Intake    940 ml  Output   1000 ml  Net    -60 ml   General: resting in bed, NAD HEENT: PERRL, EOMI, no scleral icterus,  Cardiac: RRR, no rubs, murmurs or gallops Pulm: clear to auscultation bilaterally without wheezing or crackles Abd: soft, distended with mild tenderness all over on palpation stable from prior exams, BS present Ext: warm and well perfused, no pedal edema, pain to palpation of L leg and knee though no asymmetry noted Neuro: responds to questions appropriately; moving all extremities freely  Lab Results: Basic Metabolic Panel:  Recent Labs Lab 05/14/14 1142 05/14/14 2330 05/15/14 0200  NA 135  --  135  K 2.7*  --  4.5  CL 98  --  102  CO2 24  --  25  GLUCOSE 184*  --  211*  BUN <5*  --  <5*  CREATININE 0.71  --  0.78  CALCIUM 7.5*  --  7.5*  MG  --  1.6  --   PHOS  --  2.5  --    Liver Function Tests:  Recent Labs Lab 05/14/14 1142  AST 26  ALT 27  ALKPHOS 84  BILITOT 0.3  PROT 5.4*  ALBUMIN 3.0*    Recent Labs Lab  05/14/14 1142  AMMONIA 68*   CBC:  Recent Labs Lab 05/14/14 1142  WBC 6.1  NEUTROABS 5.0  HGB 10.8*  HCT 32.5*  MCV 83.5  PLT 158   Cardiac Enzymes:  Recent Labs Lab 05/14/14 2316 05/15/14 0509 05/16/14 1049  TROPONINI <0.03 <0.03 <0.03   Coagulation:  Recent Labs Lab 05/14/14 1142  LABPROT 14.1  INR 1.07   Urine Drug Screen: Drugs of Abuse     Component Value Date/Time   LABOPIA NONE DETECTED 05/14/2014 1206   COCAINSCRNUR NONE DETECTED 05/14/2014 1206   LABBENZ POSITIVE* 05/14/2014 1206   AMPHETMU NONE DETECTED 05/14/2014 1206   THCU NONE DETECTED 05/14/2014 1206   LABBARB NONE DETECTED 05/14/2014 1206    Alcohol Level:  Recent Labs Lab 05/14/14 1142  ETH 112*   Urinalysis:  Recent Labs Lab 05/14/14 1206  COLORURINE YELLOW  LABSPEC 1.009  PHURINE 6.0  GLUCOSEU 100*  HGBUR SMALL*  BILIRUBINUR NEGATIVE  KETONESUR NEGATIVE  PROTEINUR NEGATIVE  UROBILINOGEN 1.0  NITRITE NEGATIVE  LEUKOCYTESUR NEGATIVE     Micro Results: Recent Results (from  the past 240 hour(s))  MRSA PCR Screening     Status: None   Collection Time: 05/14/14  7:27 PM  Result Value Ref Range Status   MRSA by PCR NEGATIVE NEGATIVE Final    Comment:        The GeneXpert MRSA Assay (FDA approved for NASAL specimens only), is one component of a comprehensive MRSA colonization surveillance program. It is not intended to diagnose MRSA infection nor to guide or monitor treatment for MRSA infections.    Studies/Results: No results found. Medications: I have reviewed the patient's current medications. Scheduled Meds: . amLODipine  5 mg Oral Daily  . budesonide-formoterol  2 puff Inhalation BID  . diclofenac sodium  2 g Topical QID  . FLUoxetine  20 mg Oral Daily  . folic acid  1 mg Oral Daily  . lactulose  10 g Oral Daily  . meloxicam  7.5 mg Oral Q breakfast  . multivitamin with minerals  1 tablet Oral Daily  . pantoprazole  40 mg Oral BID AC  . predniSONE  40  mg Oral Q breakfast  . senna-docusate  2 tablet Oral BID  . sodium chloride  3 mL Intravenous Q12H  . thiamine  100 mg Oral Daily   Or  . thiamine  100 mg Intravenous Daily   Continuous Infusions:  PRN Meds:.acetaminophen, bisacodyl, guaifenesin, ipratropium-albuterol, traZODone Assessment/Plan:  Homicidal & suicidal ideation: Recently hospitalized 3 months ago per Dr. Anders Simmonds though no longer meeting criteria for inpatient psych as noted above. He remains intermittently uncooperative but is otherwise pleasant on exam. PT recommends intermittent supervision along with four-wheeled walker with suit due to exertional fatigue. -Continue Prozac 86m & trazodone 1022mQHS prn for insomnia per Psych -Discontinue sitter and suicide precautions -[place] -If necessary, contact [phone] records of prior psych hospitalization  Low back pain: No complaints with Tylenol 5001m6h prn yesterday. Only side effect to ASA he noted was some heartburn; he denied any prior history of GI bleeding. Given that he has no long-term medical follow-up in place, opiate therapy was not advisable. -Continue Tylenol 500m46mh prn for pain as he does not have objective findings for liver disease  -Start Mobic 7.5mg 47m  Left leg pain: Refused Lovenox x 2 days due to bruising. Advised him to move out of bed to reduce risk of blood clots to which he was agreeable. DVT less likely given that his leg did not appear inflamed, warm, red; tenderness mainly overlying anterior aspect and not the calf. -Order SCDs  Constipation: No BMs charted since admission and he is unable to recall if he has had one or not. He is agreeable to enema or suppository. -Give rectal suppository  Elevated ammonia: He appears back to baseline now and has no objective signs of liver disease other than some non-specific increased liver  echogenicity. LFTs normal on admission.  -Continue lactulose 10g for now -Continue reassessing mental status  HTN: Systolic BP mainly in 100s 076Korvasc 5mg. 24montinue current therapy.  Pre-diabetes: A1c 6.4 on admission. -Advise lifestyle modification once acute illness resolves -Consider metformin once PCP follow-up is established  Presumed COPD exacerbation: No PFTs on file though smoking history, symptoms, and prior home oxygen therapy are suggestive of chronic disease, especially given his productive cough. Lung exam findings stable from yesterday and now on room air. Missed one dose of prednisone as he refused it and will  get it today; completed Levaquin x 5 days. -Continue Duonebs q6h prn -Give Symbicort 2 puffs BID -Give prednisone 17m [Day 5/5]  Polysubstance abuse: CAGE 2/4 on admission along with 30-pack year smoking history. CIWA 0 for 24 hours.  -Consider nicotine patch if he is asking for cigarettes  Anemia: Hb 10 on admission. Likely 2/2 chronic EtOH abuse as he has no active blood loss or signs of it. -Consider workup as outpatient  Possible prostate cancer: PSA 5.5 which is elevated and likely age-related as this value does increase with age. He reported being told he had prostate cancer by another provider and did so at his last prior psych hospitalization as well.  -Follow-up as outpatient  #FEN:  -Diet: Regular  #DVT prophylaxis: SCDs [not agreeable to injection]  #CODE STATUS: FULL CODE  Dispo: Disposition is deferred at this time, awaiting improvement of current medical problems. Pending ALF placement.  The patient does not have a current PCP (No primary care provider on file.) and does not know [facility] after [facility].  The patient does have transportation limitations that hinder transportation to clinic appointments.  .Services Needed at time of discharge: Y = Yes, Blank = No PT:   OT:   RN:   Equipment:     Other:     LOS: 5 days   RCharlott Rakes MD 05/19/2014, 3:53 PM

## 2014-05-19 NOTE — Clinical Social Work Note (Signed)
Psych CSW updated MD regarding change in disposition.  MD agreeable to placing PT orders to assist with determining appropriate level of care.  Possible ALF. Unit CSW is aware and will continue with placement.  Psych CSW remains available for assistance.  Nonnie Done, [place] [phone]  Psychiatric & Orthopedics (5N 1-16) Clinical Social Worker

## 2014-05-19 NOTE — Progress Notes (Signed)
Internal Medicine Attending  Date: 05/19/2014  Patient name: Mitchell Molina Medical record number: [id number] Date of birth: 1940-06-03 Age: 74 y.o. Gender: male  I saw and evaluated the patient. I discussed patient and reviewed the resident's note by Dr. Posey Pronto, and I agree with the resident's findings and plans as documented in his note.

## 2014-05-20 DIAGNOSIS — R0789 Other chest pain: Secondary | ICD-10-CM | POA: Diagnosis not present

## 2014-05-20 DIAGNOSIS — F333 Major depressive disorder, recurrent, severe with psychotic symptoms: Secondary | ICD-10-CM | POA: Diagnosis not present

## 2014-05-20 DIAGNOSIS — R079 Chest pain, unspecified: Secondary | ICD-10-CM | POA: Diagnosis not present

## 2014-05-20 MED ORDER — FOLIC ACID 1 MG PO TABS: 1.0000 mg | ORAL_TABLET | Freq: Every day | ORAL | Status: AC

## 2014-05-20 MED ORDER — BUDESONIDE-FORMOTEROL FUMARATE 160-4.5 MCG/ACT IN AERO: 2.0000 | INHALATION_SPRAY | Freq: Two times a day (BID) | RESPIRATORY_TRACT | Status: AC

## 2014-05-20 MED ORDER — LACTULOSE 10 GM/15ML PO SOLN: 10.0000 g | Freq: Every day | ORAL | Status: AC | PRN

## 2014-05-20 MED ORDER — PANTOPRAZOLE SODIUM 40 MG PO TBEC: 40.0000 mg | DELAYED_RELEASE_TABLET | Freq: Two times a day (BID) | ORAL | Status: AC

## 2014-05-20 MED ORDER — IPRATROPIUM-ALBUTEROL 18-103 MCG/ACT IN AERO: 1.0000 | INHALATION_SPRAY | Freq: Four times a day (QID) | RESPIRATORY_TRACT | Status: AC | PRN

## 2014-05-20 MED ORDER — TUBERCULIN PPD 5 UNIT/0.1ML ID SOLN
5.0000 [IU] | Freq: Once | INTRADERMAL | Status: DC
  Filled 2014-05-20: qty 0.1

## 2014-05-20 MED ORDER — ADULT MULTIVITAMIN W/MINERALS CH: 1.0000 | ORAL_TABLET | Freq: Every day | ORAL | Status: AC

## 2014-05-20 MED ORDER — AMLODIPINE BESYLATE 5 MG PO TABS: 5.0000 mg | ORAL_TABLET | Freq: Every day | ORAL | Status: AC

## 2014-05-20 MED ORDER — DICLOFENAC SODIUM 1 % TD GEL: 2.0000 g | Freq: Four times a day (QID) | TRANSDERMAL | Status: AC

## 2014-05-20 MED ORDER — TUBERCULIN PPD 5 UNIT/0.1ML ID SOLN
5.0000 [IU] | Freq: Once | INTRADERMAL | Status: DC
Start: 2014-05-20 — End: 2014-05-20

## 2014-05-20 MED ORDER — ACETAMINOPHEN 500 MG PO TABS: 500.0000 mg | ORAL_TABLET | Freq: Four times a day (QID) | ORAL | Status: DC | PRN

## 2014-05-20 MED ORDER — TUBERCULIN PPD 5 UNIT/0.1ML ID SOLN
5.0000 [IU] | INTRADERMAL | Status: DC
  Filled 2014-05-20: qty 0.1

## 2014-05-20 MED ORDER — TRAZODONE HCL 100 MG PO TABS: 100.0000 mg | ORAL_TABLET | Freq: Every evening | ORAL | Status: AC | PRN

## 2014-05-20 MED ORDER — MELOXICAM 7.5 MG PO TABS: 7.5000 mg | ORAL_TABLET | Freq: Every day | ORAL | Status: AC

## 2014-05-20 MED ORDER — FLUOXETINE HCL 20 MG PO CAPS: 20.0000 mg | ORAL_CAPSULE | Freq: Every day | ORAL | Status: AC

## 2014-05-20 NOTE — Progress Notes (Signed)
Patient referred to this CSW from [place] as patient has been cleared by Psychiatrist and now requires ALF placement. CSW met with patient this evening and he is agreeable to this. Patient is currently homeless and states that he does not have any family to assist him. He is also in need of clothes, reading glasses and shoes.  CSW discussed search for ALF bed and search initiated.  Awaiting call back from facilities.  Spoke to [employer] at [employer] and she is currently reviewing his paperwork as well. Patient has Medicaid that will need to be switched to Special Assistance. Also- his check is currently being held by [employer] as he does not have an address to deliver his check.  Once placed, this can be reconciled.  Sitter has been d/c'd and will proceed with placement- hopefully in the morning if stable per MD.  Patient aware of above and agreeable.  Lorie Phenix. Pauline Good, [place]

## 2014-05-20 NOTE — Consult Note (Signed)
Psychiatry Consult follow-up note  Reason for Consult: depression and suicidal thoughts Referring Physician:  Dr. Posey Pronto Patient Identification: Mitchell Molina MRN:  [id number] Principal Diagnosis: Severe recurrent major depressive disorder with psychotic features Diagnosis:   Patient Active Problem List   Diagnosis Date Noted  . Severe recurrent major depressive disorder with psychotic features [F33.3] 05/15/2014  . Alcohol use disorder, moderate, dependence [F10.20] 05/15/2014  . Chest pain [R07.9]   . Suicidal ideation [R45.851]   . Hypokalemia [E87.6] 05/14/2014    Total Time spent with patient: 30 minutes  Subjective:   Mitchell Molina is a 74 y.o. male patient admitted with chest pain and suicidal thoughts.  HPI: Mr. Mitchell Molina is a 74 y.o. male with history of major depression, alcoholism, cirrhosis, COPD who presents to the hospital with chest pain, depression, suicidal ideation, abdominal pain, vomiting. Patient reports prior history of treatment for depression, currently he is reporting worsening depressive symptoms such as low energy level, lack of motivation, poor concentration, insomnia, feeling hopeless, helpless and suicidal thoughts with no plan. Patient also endorses hearing voices telling him that people are going to kill him, also feels that people are out to get him. Patient reports that he is currently homeless. He is not on medications for his depression but has been drinking alcohol to self medicate. Patient reports long history of drinking, drinks about 3-4 days per week when he has money and reports history of black out and tremors. He denies history of drug use but smokes at least a pack of cigarette daily. Patient States that he has cirrhosis from the alcohol and has been vomiting from that. He hasn't been seeing a doctor for years. Also states that he has prostate cancer but is not currently getting treated. He is requesting to get help for the treatment of his  depression.  Interval note: Patient seen for psychiatric consultation follow-up. Patient reported he has been feeling better and able to walk with the help of walker. Patient denies current suicidal ideation, homicidal ideation and disturbance of sleep and appetite. Patient continued to have multiple physical complaints mostly generalized weakness and left knee pains. Patient is willing to work with the psychiatric social service regarding out-of-home placement as discussed.  Patient has been drinking alcohol by borrowing money from here and there and denies current symptoms of alcohol withdrawal or craving. Patient report depressed secondary to psychosocial situation but currently feeling better since he has discussed about receiving psychosocial help. Patient has been compliant with his medication and has no reported side effects. Patient requested out-of-home placement like assisted living facility where he can have stable and structured environment and able to receive his disability benefits. Reportedly could not get his disability benefits because is not depressed for sometime about a year or so. Marland Kitchen   Past Medical History:  Past Medical History  Diagnosis Date  . COPD (chronic obstructive pulmonary disease)   . Diabetes mellitus without complication   . Hypertension   . Coronary artery disease   . Arthritis   . Asthma   . Cancer   . CHF (congestive heart failure)   . Seizures   . Stroke   . Renal disorder   . Cirrhosis     Past Surgical History  Procedure Laterality Date  . Hernia repair    . Hemorrhoid surgery     Family History:  Family History  Problem Relation Age of Onset  . CVA Father   . Colon cancer Mother  Social History:  History  Alcohol Use  . 0.0 oz/week  . 0 Not specified per week    Comment: daily; liquor or wine; last drink yesterday     History  Drug Use No    History   Social History  . Marital Status: Legally Separated    Spouse Name: N/A     Number of Children: 0  . Years of Education: N/A   Social History Main Topics  . Smoking status: Current Some Day Smoker  . Smokeless tobacco: None     Comment: He says that he quit two days ago.   . Alcohol Use: 0.0 oz/week    0 Not specified per week     Comment: daily; liquor or wine; last drink yesterday  . Drug Use: No  . Sexual Activity: None   Other Topics Concern  . None   Social History Narrative   Lives alone.    Additional Social History:                          Allergies:   Allergies  Allergen Reactions  . Asa [Aspirin] Other (See Comments)    States he doesn't know, but he will not take it  . Tylenol [Acetaminophen] Other (See Comments)    Can not take due to Liver issues    Vitals: Blood pressure 108/58, pulse 51, temperature 98.8 F (37.1 C), temperature source Oral, resp. rate 20, height 5\' 10"  (1.778 m), weight 101 kg (222 lb 10.6 oz), SpO2 97 %.  Risk to Self: Is patient at risk for suicide?: Yes Risk to Others:   Prior Inpatient Therapy:   Prior Outpatient Therapy:    Current Facility-Administered Medications  Medication Dose Route Frequency Provider Last Rate Last Dose  . acetaminophen (TYLENOL) tablet 500 mg  500 mg Oral Q6H PRN Axel Filler, MD   500 mg at 05/19/14 2215  . amLODipine (NORVASC) tablet 5 mg  5 mg Oral Daily Charlott Rakes, MD   5 mg at 05/19/14 0919  . bisacodyl (DULCOLAX) suppository 10 mg  10 mg Rectal Daily PRN Charlott Rakes, MD   10 mg at 05/19/14 1439  . budesonide-formoterol (SYMBICORT) 160-4.5 MCG/ACT inhaler 2 puff  2 puff Inhalation BID Bethena Roys, MD   2 puff at 05/20/14 0840  . diclofenac sodium (VOLTAREN) 1 % transdermal gel 2 g  2 g Topical QID Bethena Roys, MD   2 g at 05/19/14 2218  . FLUoxetine (PROZAC) capsule 20 mg  20 mg Oral Daily Mojeed Akintayo   20 mg at 05/19/14 0918  . folic acid (FOLVITE) tablet 1 mg  1 mg Oral Daily Ejiroghene Arlyce Dice, MD   1 mg at 05/19/14 0915  .  guaifenesin (ROBITUSSIN) 100 MG/5ML syrup 200 mg  200 mg Oral Q4H PRN Charlott Rakes, MD   200 mg at 05/17/14 1036  . ipratropium-albuterol (DUONEB) 0.5-2.5 (3) MG/3ML nebulizer solution 3 mL  3 mL Nebulization Q6H PRN Ejiroghene E Emokpae, MD      . lactulose (CHRONULAC) 10 GM/15ML solution 10 g  10 g Oral Daily Ejiroghene Arlyce Dice, MD   10 g at 05/19/14 0916  . meloxicam (MOBIC) tablet 7.5 mg  7.5 mg Oral Q breakfast Charlott Rakes, MD   7.5 mg at 05/19/14 1439  . multivitamin with minerals tablet 1 tablet  1 tablet Oral Daily Ejiroghene Arlyce Dice, MD   1 tablet at 05/19/14 0916  . pantoprazole (  PROTONIX) EC tablet 40 mg  40 mg Oral BID AC Charlott Rakes, MD   40 mg at 05/20/14 0606  . senna-docusate (Senokot-S) tablet 2 tablet  2 tablet Oral BID Charlott Rakes, MD   2 tablet at 05/19/14 2216  . sodium chloride 0.9 % injection 3 mL  3 mL Intravenous Q12H Ejiroghene Arlyce Dice, MD   3 mL at 05/19/14 0917  . thiamine (VITAMIN B-1) tablet 100 mg  100 mg Oral Daily Ejiroghene E Denton Brick, MD   100 mg at 05/19/14 9381   Or  . thiamine (B-1) injection 100 mg  100 mg Intravenous Daily Ejiroghene E Denton Brick, MD      . traZODone (DESYREL) tablet 100 mg  100 mg Oral QHS PRN Mojeed Akintayo   100 mg at 05/20/14 0300    Musculoskeletal: Strength & Muscle Tone: abnormal Gait & Station: unsteady Patient leans: forward  Psychiatric Specialty Exam: Physical Exam  Review of Systems  Constitutional: Positive for malaise/fatigue.  Eyes: Negative.   Respiratory: Negative.   Gastrointestinal: Positive for nausea.  Genitourinary: Negative.   Musculoskeletal: Positive for myalgias.  Skin: Negative.   Neurological: Positive for weakness.  Endo/Heme/Allergies: Negative.   Psychiatric/Behavioral: Positive for depression, suicidal ideas, hallucinations and substance abuse. The patient is nervous/anxious and has insomnia.     Blood pressure 108/58, pulse 51, temperature 98.8 F (37.1 C), temperature source Oral, resp.  rate 20, height 5\' 10"  (1.778 m), weight 101 kg (222 lb 10.6 oz), SpO2 97 %.Body mass index is 31.95 kg/(m^2).  General Appearance: Casual  Eye Contact::  Good  Speech:  Clear and Coherent  Volume:  Normal  Mood:  Depressed and Dysphoric  Affect:  Constricted  Thought Process:  Goal Directed  Orientation:  Full (Time, Place, and Person)  Thought Content:  Delusions and Hallucinations: Auditory  Suicidal Thoughts:  No  Homicidal Thoughts:  No  Memory:  Immediate;   Fair Recent;   Fair Remote;   Fair  Judgement:  Impaired  Insight:  Shallow  Psychomotor Activity:  Decreased  Concentration:  Fair  Recall:  [employer] of Knowledge:Good  Language: Good  Akathisia:  No  Handed:  Right  AIMS (if indicated):     Assets:  Communication Skills Desire for Improvement  ADL's:  Intact  Cognition: WNL  Sleep:   poor   Medical Decision Making: Established Problem, Worsening (2), Review or order clinical lab tests (1) Review of medication regiment & side effects (2) Review of new medications or change in dosage (2)  Treatment Plan Summary: Daily contact with patient to assess and evaluate symptoms and progress in treatment. Medication management: Continue Prozac 20mg  daily for depression. Continue Trazodone 100mg  qhs PRN for insomnia  Plan:   Continue current medication management without any changes. Patient is psychiatrically cleared at this point for out-of-home placement Patient does not meet criteria for acute psychiatric hospitalization any longer  Recommend outpatient psychiatric services and medically cleared   Disposition:  Psychiatric social service will follow up with the patient regarding possible assisted living facilities and required psychosocial support.  Durward Parcel., MD 05/20/2014 10:38 AM

## 2014-05-20 NOTE — Progress Notes (Signed)
Internal Medicine Attending  Date: 05/20/2014  Patient name: Mitchell Molina Medical record number: [id number] Date of birth: January 09, 1941 Age: 74 y.o. Gender: male  I saw and evaluated the patient, and discussed his care with resident on A.M rounds.  I reviewed the resident's note by Dr. Posey Pronto and I agree with the resident's findings and plans as documented in his note.  Patient has been cleared by psychiatry for discharge to an assisted living facility, and is medically stable for discharge today.  We discussed with patient the discharge plans and the importance of establishing with a primary care physician for follow-up of his medical problems.

## 2014-05-20 NOTE — Progress Notes (Signed)
Subjective: This AM, he reports feeling better and feels his pain is improved though still has not had a bowel movement. He refused the suppository yesterday because he wanted to try an enema.  Objective: Vital signs in last 24 hours: Filed Vitals:   05/19/14 1530 05/19/14 2100 05/20/14 0516 05/20/14 0840  BP: 107/48 99/47 108/58   Pulse: 70 55 51   Temp: 98.9 F (37.2 C) 98.2 F (36.8 C) 98.8 F (37.1 C)   TempSrc: Oral Oral Oral   Resp: 18 18 20    Height:      Weight:   222 lb 10.6 oz (101 kg)   SpO2: 96% 98% 97% 97%   Weight change: -3 lb 15.5 oz (-1.8 kg)  Intake/Output Summary (Last 24 hours) at 05/20/14 1150 Last data filed at 05/20/14 1006  Gross per 24 hour  Intake   1840 ml  Output   1200 ml  Net    640 ml   General: resting in bed, NAD HEENT: PERRL, EOMI, no scleral icterus,  Cardiac: RRR, no rubs, murmurs or gallops Pulm: clear to auscultation bilaterally without wheezing or crackles Abd: soft, distended with mild tenderness all over on palpation stable from prior exams, BS present Ext: warm and well perfused, no pedal or pretibial edema  Neuro: responds to questions appropriately; moving all extremities freely  Lab Results: Basic Metabolic Panel:  Recent Labs Lab 05/14/14 1142 05/14/14 2330 05/15/14 0200  NA 135  --  135  K 2.7*  --  4.5  CL 98  --  102  CO2 24  --  25  GLUCOSE 184*  --  211*  BUN <5*  --  <5*  CREATININE 0.71  --  0.78  CALCIUM 7.5*  --  7.5*  MG  --  1.6  --   PHOS  --  2.5  --    Liver Function Tests:  Recent Labs Lab 05/14/14 1142  AST 26  ALT 27  ALKPHOS 84  BILITOT 0.3  PROT 5.4*  ALBUMIN 3.0*    Recent Labs Lab 05/14/14 1142  AMMONIA 68*   CBC:  Recent Labs Lab 05/14/14 1142  WBC 6.1  NEUTROABS 5.0  HGB 10.8*  HCT 32.5*  MCV 83.5  PLT 158   Cardiac Enzymes:  Recent Labs Lab 05/14/14 2316 05/15/14 0509 05/16/14 1049  TROPONINI <0.03 <0.03 <0.03   Coagulation:  Recent Labs Lab  05/14/14 1142  LABPROT 14.1  INR 1.07   Urine Drug Screen: Drugs of Abuse     Component Value Date/Time   LABOPIA NONE DETECTED 05/14/2014 1206   COCAINSCRNUR NONE DETECTED 05/14/2014 1206   LABBENZ POSITIVE* 05/14/2014 1206   AMPHETMU NONE DETECTED 05/14/2014 1206   THCU NONE DETECTED 05/14/2014 1206   LABBARB NONE DETECTED 05/14/2014 1206    Alcohol Level:  Recent Labs Lab 05/14/14 1142  ETH 112*   Urinalysis:  Recent Labs Lab 05/14/14 1206  COLORURINE YELLOW  LABSPEC 1.009  PHURINE 6.0  GLUCOSEU 100*  HGBUR SMALL*  BILIRUBINUR NEGATIVE  KETONESUR NEGATIVE  PROTEINUR NEGATIVE  UROBILINOGEN 1.0  NITRITE NEGATIVE  LEUKOCYTESUR NEGATIVE     Micro Results: Recent Results (from the past 240 hour(s))  MRSA PCR Screening     Status: None   Collection Time: 05/14/14  7:27 PM  Result Value Ref Range Status   MRSA by PCR NEGATIVE NEGATIVE Final    Comment:        The GeneXpert MRSA Assay (FDA approved for NASAL specimens  only), is one component of a comprehensive MRSA colonization surveillance program. It is not intended to diagnose MRSA infection nor to guide or monitor treatment for MRSA infections.    Studies/Results: No results found. Medications: I have reviewed the patient's current medications. Scheduled Meds: . amLODipine  5 mg Oral Daily  . budesonide-formoterol  2 puff Inhalation BID  . diclofenac sodium  2 g Topical QID  . FLUoxetine  20 mg Oral Daily  . folic acid  1 mg Oral Daily  . lactulose  10 g Oral Daily  . meloxicam  7.5 mg Oral Q breakfast  . multivitamin with minerals  1 tablet Oral Daily  . pantoprazole  40 mg Oral BID AC  . senna-docusate  2 tablet Oral BID  . sodium chloride  3 mL Intravenous Q12H  . thiamine  100 mg Oral Daily   Or  . thiamine  100 mg Intravenous Daily   Continuous Infusions:  PRN Meds:.acetaminophen, bisacodyl, guaifenesin, ipratropium-albuterol, traZODone Assessment/Plan:  Homicidal & suicidal  ideation: Recently hospitalized 3 months ago per Dr. Anders Simmonds though no longer meeting criteria for inpatient psych as noted yesterday. Per Social Work, he has placement and can go today.  -Discharge on Prozac 20mg  & trazodone 100mg  QHS prn for insomnia per Psych  Low back pain: No complaints with Tylenol 500mg  q6h prn yesterday along with Mobic 7.5mg  QD.  -Discharge on Tylenol 500mg  q6h prn & Mobic 7.5mg  QD   Constipation: No BMs charted since admission, and he refused suppository yesterday. -Give soap suds enema  Elevated ammonia: He appears back to baseline now and has no objective signs of liver disease other than some non-specific increased liver echogenicity. LFTs normal on admission.  -Discharge on lactulose 10g -Continue reassessing mental status  HTN: Systolic BP mainly in 562Z on Norvasc 5mg .  -Discharge on Norvasc  Pre-diabetes: A1c 6.4 on admission. -Advise lifestyle modification once acute illness resolves -Consider metformin once PCP follow-up is established [include in d/c summary]  Presumed COPD exacerbation: No PFTs on file though smoking history, symptoms, and prior home oxygen therapy are suggestive of chronic disease, especially given his productive cough. Lung exam findings stable again today. Completed Levaquin & prednisone x 5 days. -Discharge on Symbicort 2 puffs BID  Polysubstance abuse: CAGE 2/4 on admission along with 30-pack year smoking history. CIWA 0 for 24 hours.  -Revisit smoking/drinking reduction with PCP  Anemia: Hb 10 on admission. Likely 2/2 chronic EtOH abuse as he has no active blood loss or signs of it. -Consider workup as outpatient  Possible prostate cancer: PSA 5.5 which is elevated and likely age-related as this value does increase with age. He reported being told he had prostate cancer by another provider and did so at his last prior psych hospitalization as well.  -Follow-up as outpatient  #FEN:  -Diet: Regular  #DVT prophylaxis: SCDs  [not agreeable to injection]  #CODE STATUS: FULL CODE  Dispo: Disposition is deferred at this time, awaiting improvement of current medical problems. Pending ALF placement.  The patient does not have a current PCP (No primary care provider on file.) and does not know need an [facility] hospital follow-up appointment after discharge.  The patient does have transportation limitations that hinder transportation to clinic appointments.  .Services Needed at time of discharge: Y = Yes, Blank = No PT:   OT:   RN:   Equipment:   Other:     LOS: 6 days   Charlott Rakes, MD 05/20/2014, 11:50 AM

## 2014-05-20 NOTE — Progress Notes (Signed)
Pt refused TB inj. MD aware. MD advised hold inj for now. RN will continue to monitor.

## 2014-05-20 NOTE — Progress Notes (Signed)
Pt refused TB inj. Pt says that it breaks him out badly and he does not want to receive it. MD made aware.

## 2014-05-21 NOTE — Progress Notes (Signed)
Patient discharged to friend's house in [place], transported via taxi.  Taxi voucher provided by SW.  Discharge instructions, education, medications, and follow-up discussed with patient prior to discharge; patient voiced understanding of information.

## 2014-05-21 NOTE — Progress Notes (Signed)
CSW has attempted throughout the day to obtain an ALF for patient without success thus far. Patient will be screened in the morning by Janyth Pupa, Owner of [facility] in [place]. Mr. Braulio Conte will come and visit with patient and determine if he will be an appropriate admit to the facility.  CSW provided weekend coverage handoff re: above for follow up.  Discussed with Georga Kaufmann, Surveyor, quantity of [place]. Patient is medically stable for d/c.  He was provided with 2 pair of reading glasses, a new sweet suit, socks, underwear and a pull over hoodie sweat shirt - items provided by Nonnie Done, LCSWA and this SW respectively.  He was appreciative of items provided to him.  He allowed staff to administer the PPD test which will be required by the ALF. Lorie Phenix. Pauline Good, [place]

## 2014-05-21 NOTE — Progress Notes (Signed)
Pt given taxi voucher to [facility] area.  Pt states he has a friend who lives near there.

## 2014-05-21 NOTE — Progress Notes (Signed)
Lengthy conversations with pt today.  CSW has been unable to secure an ALF for him and pt doesn't want to go to an ALF either.  Pt would like to get back to the [facility] area because he is very familiar with the services/resources available to him in that area.  Pt would also be closer to his family in [place]. If he were in [place].  CSW/pt discussed his need to get a picture ID and go to Social Security to get his financial situation straightened out.  Pt has 7-8 months of checks being held for him at the [facility] because of his homelessness/lack of mailing address, etc.  Pt would be able to secure housing if he had access to his money.  Pt confident that he can work on getting these things done in [place] because he has several contacts at various social service agencies there.  CSW/pt also discussed his desire to stay sober and get his life back on track.  Emotional support offered.  CSW can provide pt with taxi voucher to [place], either to the shelter or other address identified by pt.

## 2014-05-21 NOTE — Progress Notes (Addendum)
Subjective: Pt says he will not go to an assisted living where he will have to share a room with somebody. He says he will go today. I told him i cannot discharge him, if we can not discharge safely home, we can not discharge him to stay on the street. He voices understanding, but says he will go to a shelter if he decides to leave anyway today. I told him we can not put in discharge orders and if he wants to leave he will have to sign papers saying that he chooses to leave against medical advice.  Objective: Vital signs in last 24 hours: Filed Vitals:   05/20/14 0840 05/20/14 1300 05/20/14 1649 05/21/14 0452  BP:  109/60 110/69 122/60  Pulse:  80 82 52  Temp:  98 F (36.7 C) 98 F (36.7 C) 98 F (36.7 C)  TempSrc:  Oral Oral Oral  Resp:  20 20 18   Height:      Weight:    220 lb 14.4 oz (100.2 kg)  SpO2: 97% 99% 97% 98%   Weight change: -1 lb 12.2 oz (-0.8 kg)  Intake/Output Summary (Last 24 hours) at 05/21/14 1114 Last data filed at 05/21/14 1108  Gross per 24 hour  Intake   1300 ml  Output   2450 ml  Net  -1150 ml   General: resting in bed, NAD HEENT: PERRL, EOMI, no scleral icterus,  Cardiac: RRR, no rubs, murmurs or gallops Pulm:Normal work of breathing. Abd: soft, distended. Ext: warm and well perfused, no pedal or pretibial edema  Neuro: responds to questions appropriately; moving all extremities freely  Lab Results: Basic Metabolic Panel:  Recent Labs Lab 05/14/14 1142 05/14/14 2330 05/15/14 0200  NA 135  --  135  K 2.7*  --  4.5  CL 98  --  102  CO2 24  --  25  GLUCOSE 184*  --  211*  BUN <5*  --  <5*  CREATININE 0.71  --  0.78  CALCIUM 7.5*  --  7.5*  MG  --  1.6  --   PHOS  --  2.5  --    Liver Function Tests:  Recent Labs Lab 05/14/14 1142  AST 26  ALT 27  ALKPHOS 84  BILITOT 0.3  PROT 5.4*  ALBUMIN 3.0*    Recent Labs Lab 05/14/14 1142  AMMONIA 68*   CBC:  Recent Labs Lab 05/14/14 1142  WBC 6.1  NEUTROABS 5.0  HGB 10.8*    HCT 32.5*  MCV 83.5  PLT 158   Cardiac Enzymes:  Recent Labs Lab 05/14/14 2316 05/15/14 0509 05/16/14 1049  TROPONINI <0.03 <0.03 <0.03   Coagulation:  Recent Labs Lab 05/14/14 1142  LABPROT 14.1  INR 1.07   Urine Drug Screen: Drugs of Abuse     Component Value Date/Time   LABOPIA NONE DETECTED 05/14/2014 1206   COCAINSCRNUR NONE DETECTED 05/14/2014 1206   LABBENZ POSITIVE* 05/14/2014 1206   AMPHETMU NONE DETECTED 05/14/2014 1206   THCU NONE DETECTED 05/14/2014 1206   LABBARB NONE DETECTED 05/14/2014 1206    Alcohol Level:  Recent Labs Lab 05/14/14 1142  ETH 112*   Urinalysis:  Recent Labs Lab 05/14/14 1206  COLORURINE YELLOW  LABSPEC 1.009  PHURINE 6.0  GLUCOSEU 100*  HGBUR SMALL*  BILIRUBINUR NEGATIVE  KETONESUR NEGATIVE  PROTEINUR NEGATIVE  UROBILINOGEN 1.0  NITRITE NEGATIVE  LEUKOCYTESUR NEGATIVE     Micro Results: Recent Results (from the past 240 hour(s))  MRSA PCR Screening  Status: None   Collection Time: 05/14/14  7:27 PM  Result Value Ref Range Status   MRSA by PCR NEGATIVE NEGATIVE Final    Comment:        The GeneXpert MRSA Assay (FDA approved for NASAL specimens only), is one component of a comprehensive MRSA colonization surveillance program. It is not intended to diagnose MRSA infection nor to guide or monitor treatment for MRSA infections.    Studies/Results: No results found. Medications: I have reviewed the patient's current medications. Scheduled Meds: . amLODipine  5 mg Oral Daily  . budesonide-formoterol  2 puff Inhalation BID  . diclofenac sodium  2 g Topical QID  . FLUoxetine  20 mg Oral Daily  . folic acid  1 mg Oral Daily  . lactulose  10 g Oral Daily  . meloxicam  7.5 mg Oral Q breakfast  . multivitamin with minerals  1 tablet Oral Daily  . pantoprazole  40 mg Oral BID AC  . senna-docusate  2 tablet Oral BID  . sodium chloride  3 mL Intravenous Q12H  . thiamine  100 mg Oral Daily   Continuous  Infusions:  PRN Meds:.acetaminophen, bisacodyl, guaifenesin, ipratropium-albuterol, traZODone Assessment/Plan:  Homicidal & suicidal ideation: Recently hospitalized 3 months ago per Dr. Anders Simmonds though no longer meeting criteria for inpatient psych as noted yesterday. Per Social Work today, he has no offers. 05/21/2013- initially had an offer which was later withdrawn, because facility has a convenince store nearby, and with pts hx of  Alcohol abuse, he might go there to and get alcohol.  - Cancelled discharge orders. - Pt refused TB skin test- which is required by most assisted living facility, also refused Quantiferon B gold assay. - Continue Prozac 20mg  & trazodone 100mg  QHS prn for insomnia per Psych  Low back pain: No complaints with Tylenol 500mg  q6h prn yesterday along with Mobic 7.5mg  QD.  -Cont  Tylenol 500mg  q6h prn & Mobic 7.5mg  QD   Constipation: No BMs charted since admission, and he refused suppository yesterday. - Lactulose 10mg  dialy  Elevated ammonia: He appears back to baseline now and has no objective signs of liver disease other than some non-specific increased liver echogenicity. LFTs normal on admission.  -Cont lactulose 10g -Continue reassessing mental status  HTN: Systolic BP mainly in 696V on Norvasc 5mg .  - Cont Norvasc 5mg  daily  Pre-diabetes: A1c 6.4 on admission. -Advise lifestyle modification once acute illness resolves -Consider metformin once PCP follow-up is established [include in d/c summary]  Presumed COPD exacerbation: No PFTs on file though smoking history, symptoms, and prior home oxygen therapy are suggestive of chronic disease, especially given his productive cough. Lung exam findings stable again today. Completed Levaquin & prednisone x 5 days. - Cont Symbicort 2 puffs BID - Cont Dunebs as needed - prescribe Albuterol or combivent inhalers on discharge  Polysubstance abuse: CAGE 2/4 on admission along with 30-pack year smoking history. CIWA 0 for  24 hours.  -Revisit smoking/drinking reduction with PCP  Anemia: Hb 10 on admission. Likely 2/2 chronic EtOH abuse as he has no active blood loss or signs of it. -Consider workup as outpatient  Possible prostate cancer: PSA 5.5 which is elevated and likely age-related as this value does increase with age. He reported being told he had prostate cancer by another provider and did so at his last prior psych hospitalization as well.  -Follow-up as outpatient  #FEN:  -Diet: Regular  #DVT prophylaxis: SCDs [not agreeable to injection]  #CODE STATUS: FULL CODE  Dispo: Disposition is deferred at this time, awaiting improvement of current medical problems. Pending ALF placement.  Addendum- 2pm- Pt requesting to go to [facility], he will be provided transportation fare. He has capacity and has been cleared by Psych. He is presently refusing to go to an assisted living facility. Will discharge pt.    LOS: 7 days   Bethena Roys, MD 05/21/2014, 11:14 AM

## 2014-05-28 ENCOUNTER — Emergency Department (HOSPITAL_COMMUNITY)
Admission: EM | Admit: 2014-05-28 | Discharge: 2014-05-31 | Disposition: A | Discharge status: Home / Self Care | Payer: Medicare Other | Attending: Emergency Medicine | Admitting: Emergency Medicine

## 2014-05-28 ENCOUNTER — Emergency Department (HOSPITAL_COMMUNITY): Payer: Medicare Other

## 2014-05-28 DIAGNOSIS — J441 Chronic obstructive pulmonary disease with (acute) exacerbation: Secondary | ICD-10-CM | POA: Diagnosis not present

## 2014-05-28 DIAGNOSIS — Z79899 Other long term (current) drug therapy: Secondary | ICD-10-CM | POA: Diagnosis not present

## 2014-05-28 DIAGNOSIS — Z87448 Personal history of other diseases of urinary system: Secondary | ICD-10-CM | POA: Insufficient documentation

## 2014-05-28 DIAGNOSIS — Z720 Tobacco use: Secondary | ICD-10-CM | POA: Diagnosis not present

## 2014-05-28 DIAGNOSIS — E119 Type 2 diabetes mellitus without complications: Secondary | ICD-10-CM | POA: Insufficient documentation

## 2014-05-28 DIAGNOSIS — I10 Essential (primary) hypertension: Secondary | ICD-10-CM | POA: Insufficient documentation

## 2014-05-28 DIAGNOSIS — M1990 Unspecified osteoarthritis, unspecified site: Secondary | ICD-10-CM | POA: Diagnosis not present

## 2014-05-28 DIAGNOSIS — Z7951 Long term (current) use of inhaled steroids: Secondary | ICD-10-CM | POA: Insufficient documentation

## 2014-05-28 DIAGNOSIS — Z8673 Personal history of transient ischemic attack (TIA), and cerebral infarction without residual deficits: Secondary | ICD-10-CM | POA: Diagnosis not present

## 2014-05-28 DIAGNOSIS — R0789 Other chest pain: Secondary | ICD-10-CM | POA: Diagnosis not present

## 2014-05-28 DIAGNOSIS — F10120 Alcohol abuse with intoxication, uncomplicated: Secondary | ICD-10-CM | POA: Diagnosis not present

## 2014-05-28 DIAGNOSIS — Z859 Personal history of malignant neoplasm, unspecified: Secondary | ICD-10-CM | POA: Insufficient documentation

## 2014-05-28 DIAGNOSIS — I2510 Atherosclerotic heart disease of native coronary artery without angina pectoris: Secondary | ICD-10-CM | POA: Insufficient documentation

## 2014-05-28 DIAGNOSIS — I509 Heart failure, unspecified: Secondary | ICD-10-CM | POA: Insufficient documentation

## 2014-05-28 DIAGNOSIS — Z791 Long term (current) use of non-steroidal anti-inflammatories (NSAID): Secondary | ICD-10-CM | POA: Diagnosis not present

## 2014-05-28 DIAGNOSIS — F1020 Alcohol dependence, uncomplicated: Secondary | ICD-10-CM | POA: Diagnosis present

## 2014-05-28 DIAGNOSIS — R079 Chest pain, unspecified: Secondary | ICD-10-CM

## 2014-05-28 DIAGNOSIS — F10920 Alcohol use, unspecified with intoxication, uncomplicated: Secondary | ICD-10-CM

## 2014-05-28 LAB — CBC WITH DIFFERENTIAL/PLATELET
BASOS ABS: 0 10*3/uL (ref 0.0–0.1)
BASOS PCT: 1 % (ref 0–1)
Eosinophils Absolute: 0.1 10*3/uL (ref 0.0–0.7)
Eosinophils Relative: 3 % (ref 0–5)
HEMATOCRIT: 34 % — AB (ref 39.0–52.0)
Hemoglobin: 11 g/dL — ABNORMAL LOW (ref 13.0–17.0)
LYMPHS ABS: 0.8 10*3/uL (ref 0.7–4.0)
LYMPHS PCT: 17 % (ref 12–46)
MCH: 27.5 pg (ref 26.0–34.0)
MCHC: 32.4 g/dL (ref 30.0–36.0)
MCV: 85 fL (ref 78.0–100.0)
Monocytes Absolute: 0.3 10*3/uL (ref 0.1–1.0)
Monocytes Relative: 7 % (ref 3–12)
NEUTROS PCT: 72 % (ref 43–77)
Neutro Abs: 3.4 10*3/uL (ref 1.7–7.7)
Platelets: 248 10*3/uL (ref 150–400)
RBC: 4 MIL/uL — ABNORMAL LOW (ref 4.22–5.81)
RDW: 14.3 % (ref 11.5–15.5)
WBC: 4.8 10*3/uL (ref 4.0–10.5)

## 2014-05-28 LAB — COMPREHENSIVE METABOLIC PANEL
ALBUMIN: 3.5 g/dL (ref 3.5–5.2)
ALT: 24 U/L (ref 0–53)
ANION GAP: 13 (ref 5–15)
AST: 23 U/L (ref 0–37)
Alkaline Phosphatase: 72 U/L (ref 39–117)
BUN: 10 mg/dL (ref 6–23)
CO2: 23 mmol/L (ref 19–32)
CREATININE: 0.7 mg/dL (ref 0.50–1.35)
Calcium: 8 mg/dL — ABNORMAL LOW (ref 8.4–10.5)
Chloride: 106 mmol/L (ref 96–112)
GFR calc Af Amer: >90 mL/min (ref >90)
Glucose, Bld: 161 mg/dL — ABNORMAL HIGH (ref 70–99)
POTASSIUM: 3.6 mmol/L (ref 3.5–5.1)
Sodium: 142 mmol/L (ref 135–145)
TOTAL PROTEIN: 6.1 g/dL (ref 6.0–8.3)
Total Bilirubin: 0.4 mg/dL (ref 0.3–1.2)

## 2014-05-28 LAB — AMMONIA: AMMONIA: 39 umol/L — AB (ref 11–32)

## 2014-05-28 LAB — TROPONIN I: Troponin I: <0.03 ng/mL (ref <0.031)

## 2014-05-28 LAB — ETHANOL: ALCOHOL ETHYL (B): 92 mg/dL — AB (ref 0–9)

## 2014-05-28 NOTE — ED Notes (Signed)
Patient refused lab draw at this time. RN made aware.

## 2014-05-28 NOTE — ED Provider Notes (Signed)
CSN: [id number]     Arrival date & time 05/28/14  1503 History   First MD Initiated Contact with Patient 05/28/14 1510     Chief Complaint  Patient presents with  . Alcohol Intoxication     (Consider location/radiation/quality/duration/timing/severity/associated sxs/prior Treatment) HPI Comments: Pt with hx of alcohol abuse, cirrhosis, CAD, COPD, homelessness presents with ETOH intoxication and chest pain.  He was found by [employer] to apparently intoxicated.  Says that he has been hurting in his chest for about an hour.  Says that he has SOB, which he always has due to his COPD.  Denies cough, when I ask if he has had any fevers, he says "I don't Goddamn know".  When I ask where he is staying right now, he says, anywhere that I can.  He reports vomiting, but is asking for something to eat and starts cussing at me when I say that we will start with something to drink.  History is limited due to his apparent intoxication.  Patient is a 74 y.o. male presenting with intoxication.  Alcohol Intoxication Associated symptoms include chest pain and shortness of breath. Pertinent negatives include no abdominal pain and no headaches.    Past Medical History  Diagnosis Date  . COPD (chronic obstructive pulmonary disease)   . Diabetes mellitus without complication   . Hypertension   . Coronary artery disease   . Arthritis   . Asthma   . Cancer   . CHF (congestive heart failure)   . Seizures   . Stroke   . Renal disorder   . Cirrhosis    Past Surgical History  Procedure Laterality Date  . Hernia repair    . Hemorrhoid surgery     Family History  Problem Relation Age of Onset  . CVA Father   . Colon cancer Mother    History  Substance Use Topics  . Smoking status: Current Some Day Smoker  . Smokeless tobacco: Not on file     Comment: He says that he quit two days ago.   . Alcohol Use: 0.0 oz/week    0 Not specified per week     Comment: daily; liquor or wine; last drink yesterday     Review of Systems  Constitutional: Negative for fever, chills, diaphoresis and fatigue.  HENT: Negative for congestion, rhinorrhea and sneezing.   Eyes: Negative.   Respiratory: Positive for shortness of breath. Negative for cough and chest tightness.   Cardiovascular: Positive for chest pain. Negative for leg swelling.  Gastrointestinal: Positive for vomiting. Negative for abdominal pain, diarrhea and blood in stool.  Genitourinary: Negative for frequency, hematuria, flank pain and difficulty urinating.  Musculoskeletal: Negative for back pain and arthralgias.  Skin: Negative for rash.  Neurological: Negative for dizziness, speech difficulty, weakness, numbness and headaches.      Allergies  Asa and Tylenol  Home Medications   Prior to Admission medications   Medication Sig Start Date End Date Taking? Authorizing Provider  acetaminophen (TYLENOL) 500 MG tablet Take 1 tablet (500 mg total) by mouth every 6 (six) hours as needed for moderate pain. 05/20/14   Charlott Rakes, MD  albuterol-ipratropium (COMBIVENT) 18-103 MCG/ACT inhaler Inhale 1-2 puffs into the lungs every 6 (six) hours as needed for wheezing or shortness of breath (Use as rescue inhaler). 05/20/14   Charlott Rakes, MD  amLODipine (NORVASC) 5 MG tablet Take 1 tablet (5 mg total) by mouth daily. 05/20/14   Charlott Rakes, MD  budesonide-formoterol (SYMBICORT) 160-4.5 MCG/ACT inhaler Inhale  2 puffs into the lungs 2 (two) times daily. 05/20/14   Charlott Rakes, MD  diclofenac sodium (VOLTAREN) 1 % GEL Apply 2 g topically 4 (four) times daily. 05/20/14   Charlott Rakes, MD  FLUoxetine (PROZAC) 20 MG capsule Take 1 capsule (20 mg total) by mouth daily. 05/20/14   Charlott Rakes, MD  folic acid (FOLVITE) 1 MG tablet Take 1 tablet (1 mg total) by mouth daily. 05/20/14   Charlott Rakes, MD  lactulose (CHRONULAC) 10 GM/15ML solution Take 15 mLs (10 g total) by mouth daily as needed for mild constipation. 05/20/14   Charlott Rakes, MD  meloxicam  (MOBIC) 7.5 MG tablet Take 1 tablet (7.5 mg total) by mouth daily with breakfast. 05/20/14   Charlott Rakes, MD  Multiple Vitamin (MULTIVITAMIN WITH MINERALS) TABS tablet Take 1 tablet by mouth daily. 05/20/14   Charlott Rakes, MD  pantoprazole (PROTONIX) 40 MG tablet Take 1 tablet (40 mg total) by mouth 2 (two) times daily before a meal. 05/20/14   Charlott Rakes, MD  traZODone (DESYREL) 100 MG tablet Take 1 tablet (100 mg total) by mouth at bedtime as needed for sleep. 05/20/14   Charlott Rakes, MD   BP 121/52 mmHg  Pulse 101  Temp(Src) 99.9 F (37.7 C)  Resp 16  SpO2 100% Physical Exam  Constitutional: He appears well-developed and well-nourished.  HENT:  Head: Normocephalic and atraumatic.  Eyes: Pupils are equal, round, and reactive to light.  Neck: Normal range of motion. Neck supple.  Cardiovascular: Normal rate, regular rhythm and normal heart sounds.   Pulmonary/Chest: Effort normal and breath sounds normal. No respiratory distress. He has no wheezes. He has no rales. He exhibits no tenderness.  Abdominal: Soft. Bowel sounds are normal. He exhibits distension. There is no tenderness. There is no rebound and no guarding.  Musculoskeletal: Normal range of motion. He exhibits no edema.  Lymphadenopathy:    He has no cervical adenopathy.  Neurological: He is alert.  Answers questions, but very combative, moving all extremities symmetrically  Skin: Skin is warm and dry. No rash noted.    ED Course  Procedures (including critical care time) Labs Review Labs Reviewed  COMPREHENSIVE METABOLIC PANEL - Abnormal; Notable for the following:    Glucose, Bld 161 (*)    Calcium 8.0 (*)    All other components within normal limits  CBC WITH DIFFERENTIAL/PLATELET - Abnormal; Notable for the following:    RBC 4.00 (*)    Hemoglobin 11.0 (*)    HCT 34.0 (*)    All other components within normal limits  AMMONIA - Abnormal; Notable for the following:    Ammonia 39 (*)    All other components  within normal limits  ETHANOL - Abnormal; Notable for the following:    Alcohol, Ethyl (B) 92 (*)    All other components within normal limits  TROPONIN I    Imaging Review No results found.   EKG Interpretation   Date/Time:  Saturday May 28 2014 15:16:23 EST Ventricular Rate:  95 PR Interval:  159 QRS Duration: 85 QT Interval:  363 QTC Calculation: 456 R Axis:   -154 Text Interpretation:  Sinus rhythm Low voltage with right axis deviation  Abnormal R-wave progression, late transition Borderline repolarization  abnormality Baseline wander in lead(s) V3 since last tracing no  significant change Confirmed by Emlyn Maves  MD, Mirenda Baltazar ([id number]) on 05/28/2014  3:46:53 PM      MDM   Final diagnoses:  Alcohol intoxication, uncomplicated  Chest pain, unspecified  chest pain type    18:38 pt initially was refusing labs and was very combative.  Now is more calm, will reattempt labs  We are ultimately able to get labs on patient. His troponin is negative. His LFTs are normal. His other labs are unremarkable. His alcohol level is actually not very high. He's been sleeping without incident. Will ambulate him and discharge after that.  Malvin Johns, MD 05/28/14 2330

## 2014-05-28 NOTE — ED Notes (Signed)
Patient voided in bed.  Unable to give sample at this time

## 2014-05-28 NOTE — ED Notes (Signed)
Pt continues to refuse blood work.

## 2014-05-28 NOTE — ED Notes (Signed)
Per ems. Pt was picked up from bus stop. Per ems pt is SI, smells of alcohol and Listerin. Pt was discharged on 2/4 from [facility] Dx od alcoholism. Hx COPD, Prostate CA. Vs 130/74 HR 80, RR 18. CBG 123. O2 96% RA. Pt is alert and oriented x3.

## 2014-05-28 NOTE — ED Notes (Signed)
Hold lab draw per Dr. Tamera Punt.

## 2014-05-28 NOTE — ED Notes (Signed)
Patient refused to let me draw labs nurse is aware.

## 2014-05-28 NOTE — ED Notes (Signed)
Bed: PT46 Expected date:  Expected time:  Means of arrival:  Comments: EMS- ETOH

## 2014-05-28 NOTE — ED Notes (Signed)
Tried to herp patient with urinal but did not go.

## 2014-05-29 DIAGNOSIS — R45851 Suicidal ideations: Secondary | ICD-10-CM | POA: Diagnosis not present

## 2014-05-29 DIAGNOSIS — R45850 Homicidal ideations: Secondary | ICD-10-CM

## 2014-05-29 DIAGNOSIS — F1020 Alcohol dependence, uncomplicated: Secondary | ICD-10-CM | POA: Diagnosis not present

## 2014-05-29 MED ORDER — LORAZEPAM 1 MG PO TABS: 0.0000 mg | ORAL_TABLET | Freq: Two times a day (BID) | ORAL | Status: DC

## 2014-05-29 MED ORDER — PANTOPRAZOLE SODIUM 40 MG PO TBEC
40.0000 mg | DELAYED_RELEASE_TABLET | Freq: Two times a day (BID) | ORAL | Status: DC
  Administered 2014-05-29 – 2014-05-31 (×5): 40 mg via ORAL
  Filled 2014-05-29 (×5): qty 1

## 2014-05-29 MED ORDER — LACTULOSE 10 GM/15ML PO SOLN
10.0000 g | Freq: Every day | ORAL | Status: DC | PRN
Start: 2014-05-29 — End: 2014-05-31
  Filled 2014-05-29: qty 15

## 2014-05-29 MED ORDER — IPRATROPIUM-ALBUTEROL 18-103 MCG/ACT IN AERO: 1.0000 | INHALATION_SPRAY | Freq: Four times a day (QID) | RESPIRATORY_TRACT | Status: DC | PRN

## 2014-05-29 MED ORDER — THIAMINE HCL 100 MG/ML IJ SOLN: 100.0000 mg | Freq: Every day | INTRAMUSCULAR | Status: DC

## 2014-05-29 MED ORDER — IPRATROPIUM-ALBUTEROL 0.5-2.5 (3) MG/3ML IN SOLN: 3.0000 mL | Freq: Four times a day (QID) | RESPIRATORY_TRACT | Status: DC | PRN

## 2014-05-29 MED ORDER — AMLODIPINE BESYLATE 5 MG PO TABS
5.0000 mg | ORAL_TABLET | Freq: Every day | ORAL | Status: DC
Start: 2014-05-29 — End: 2014-05-31
  Administered 2014-05-29 – 2014-05-31 (×3): 5 mg via ORAL
  Filled 2014-05-29 (×3): qty 1

## 2014-05-29 MED ORDER — MELOXICAM 7.5 MG PO TABS
7.5000 mg | ORAL_TABLET | Freq: Every day | ORAL | Status: DC
  Administered 2014-05-29 – 2014-05-31 (×3): 7.5 mg via ORAL
  Filled 2014-05-29 (×5): qty 1

## 2014-05-29 MED ORDER — BUDESONIDE-FORMOTEROL FUMARATE 160-4.5 MCG/ACT IN AERO
2.0000 | INHALATION_SPRAY | Freq: Two times a day (BID) | RESPIRATORY_TRACT | Status: DC
  Administered 2014-05-29 – 2014-05-31 (×4): 2 via RESPIRATORY_TRACT
  Filled 2014-05-29: qty 6

## 2014-05-29 MED ORDER — TRAMADOL HCL 50 MG PO TABS
50.0000 mg | ORAL_TABLET | Freq: Once | ORAL | Status: AC
  Administered 2014-05-29: 50 mg via ORAL
  Filled 2014-05-29: qty 1

## 2014-05-29 MED ORDER — LORAZEPAM 1 MG PO TABS
0.0000 mg | ORAL_TABLET | Freq: Four times a day (QID) | ORAL | Status: AC
  Administered 2014-05-29 – 2014-05-30 (×4): 1 mg via ORAL
  Filled 2014-05-29 (×4): qty 1

## 2014-05-29 MED ORDER — ADULT MULTIVITAMIN W/MINERALS CH
1.0000 | ORAL_TABLET | Freq: Every day | ORAL | Status: DC
  Administered 2014-05-29 – 2014-05-31 (×3): 1 via ORAL
  Filled 2014-05-29 (×4): qty 1

## 2014-05-29 MED ORDER — VITAMIN B-1 100 MG PO TABS
100.0000 mg | ORAL_TABLET | Freq: Every day | ORAL | Status: DC
  Administered 2014-05-29 – 2014-05-31 (×3): 100 mg via ORAL
  Filled 2014-05-29 (×4): qty 1

## 2014-05-29 MED ORDER — TRAZODONE HCL 100 MG PO TABS: 100.0000 mg | ORAL_TABLET | Freq: Every evening | ORAL | Status: DC | PRN

## 2014-05-29 MED ORDER — FLUOXETINE HCL 20 MG PO CAPS
20.0000 mg | ORAL_CAPSULE | Freq: Every day | ORAL | Status: DC
  Administered 2014-05-29 – 2014-05-31 (×3): 20 mg via ORAL
  Filled 2014-05-29 (×3): qty 1

## 2014-05-29 NOTE — BH Assessment (Signed)
[facility] Assessment Progress Note  Spoke with Dr. Wilson Singer and took history of pt, asked ED staff to take machine in his room for tele asessment.

## 2014-05-29 NOTE — ED Notes (Signed)
Patient unable to finish CIWA scoring.  "I don't feel like answering any more of these stupid questions."  Nurse explained what the purpose of the questions were.  Patient still refused to answer them.

## 2014-05-29 NOTE — ED Notes (Signed)
Patient is lying on his right side, resting quietly with eyes closed. Respirations are regular, even, and unlabored. Skin is warm and dry.

## 2014-05-29 NOTE — ED Notes (Signed)
Spoke with NS who will order a tray for pt for breakfast.

## 2014-05-29 NOTE — ED Notes (Signed)
Pt given water per his request.  Pt calm and cooperative at this time.

## 2014-05-29 NOTE — ED Notes (Signed)
Pt lying on his back on the stretcher with eyes closed. Appears in no respiratory distress. Respirations are even, regular, and unlabored. Skin: warm and dry.

## 2014-05-29 NOTE — ED Notes (Signed)
Pt resting quietly on his right side. Appears in no acute distress. Respirations are even, regular, and unlabored.

## 2014-05-29 NOTE — ED Notes (Signed)
MD Kohut at bedside now to evaluate abd pain.

## 2014-05-29 NOTE — ED Notes (Signed)
When entering the room, pt resting quietly with eyes closed. Appeared in no distress. Pt responds when spoken to.

## 2014-05-29 NOTE — ED Notes (Signed)
Pt resting quietly.  NAD.  Discharge papers are ready for pt when he is.  Pt aware.

## 2014-05-29 NOTE — ED Notes (Signed)
Patient resting quietly on his right side with eyes closed. Respirations are even, regular, and unlabored.
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
## 2014-05-29 NOTE — ED Provider Notes (Signed)
Pt evaluated last night and now medically cleared. Homeless though and plan was for to sober up overnight and DC in morning.  Called by nursing because pt complaining of abdominal pain before discharge. When I examined him he is now also complaining of being suicidal.. Abdominal exam is benign. W/u pretty unremarkable.  Strong suspicion for malingering, but will have evaluated by TTS.   Virgel Manifold, MD 05/29/14 [phone]

## 2014-05-29 NOTE — ED Notes (Signed)
Per MD Belfi pt is able to stay until morning.
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
## 2014-05-30 DIAGNOSIS — F1020 Alcohol dependence, uncomplicated: Secondary | ICD-10-CM | POA: Diagnosis not present

## 2014-05-30 DIAGNOSIS — R45851 Suicidal ideations: Secondary | ICD-10-CM | POA: Diagnosis not present

## 2014-05-30 DIAGNOSIS — R45850 Homicidal ideations: Secondary | ICD-10-CM | POA: Diagnosis not present

## 2014-05-30 NOTE — Progress Notes (Signed)
  CARE MANAGEMENT ED NOTE 05/30/2014  Patient:  DAEMION, MCNIEL   Account Number:  [id number]  Date Initiated:  05/30/2014  Documentation initiated by:  Livia Snellen  Subjective/Objective Assessment:   Patient presents to Ed with SI     Subjective/Objective Assessment Detail:   Patient with pmhx of alcohol withdraw seizures.  COPD, HTN, DM, CHF, renal disorder, cirrohsis, stroke, CAD.     Action/Plan:   Find inpatient placement at appropriate geri psych facility.   Action/Plan Detail:   Anticipated DC Date:       Status Recommendation to Physician:   Result of Recommendation:    Other ED [place]  Other  PCP issues    Choice offered to / List presented to:            Status of service:  Completed, signed off  ED Comments:   ED Comments Detail:  EDCM spoke to patient at bedside.  Patient confirms he has Medicare insurance without a pcp.  Patient reports his prior pcp was Dr. Luiz Ochoa in [facility] but he passed away.  Patient reports he is currently homeless.  [facility] provided patient with a list of pcps who accept Medicare insurance within the [place] zip code.  This information was placed on patient's bedside. table.  No further EDCM needs at this time.

## 2014-05-30 NOTE — Progress Notes (Signed)
Pt placed on suicide precaution due to note from 1045 this am stating he is SI, HI and has a plan.  Pt also stated to this RN that "everyone has to die someday and I hope today is my day and if it is  I am taking a few people with me.  Pt moved to room more accessible for sitter to watch for suicide precautions. Fara Olden P

## 2014-05-30 NOTE — BH Assessment (Signed)
[facility] Assessment Progress Note  The following facilities have been contacted in an effort to place this pt, with results as noted:   Beds available, information faxed, decision pending:  [place], [place] Triage Specialist 05/30/2014 @ 16:22

## 2014-05-30 NOTE — ED Notes (Signed)
Belonging moved to locker 28 to coordinate with pt new room assignment.  Fara Olden P

## 2014-05-30 NOTE — Consult Note (Signed)
[facility] Consult   Reason for Consult: Alcohol use disorder, mood disorder Referring Physician:  EDP Patient Identification: Mitchell Molina MRN:  [id number] Principal Diagnosis: Alcohol use disorder, moderate, dependence Diagnosis:   Patient Active Problem List   Diagnosis Date Noted  . Severe recurrent major depressive disorder with psychotic features [F33.3] 05/15/2014  . Alcohol use disorder, moderate, dependence [F10.20] 05/15/2014  . Chest pain [R07.9]   . Suicidal ideation [R45.851]   . Hypokalemia [E87.6] 05/14/2014    Total Time spent with patient: 25 minutes  Subjective:   Mitchell Molina is a 74 y.o. male patient who stated " I had pains in my chest and started having suicide on my mind. My mother dead, my daddy dead, all my people dead."   HPI: Mitchell Molina is a 74 yo Caucasian male who is here for depression and suicidal ideation.  He has a history of alcohol abuse and depression. He states he has been admitted to [facility] in the past.  He reports he drink alcohol, a fifth to a fifth and a half a day. Today he endorses, homicidal ideation towards "people that have done me wrong;" he has no plan. He also endorses suicidal ideation with a plan to "shoot myself or run out in front of car."  He states he has no place to live and has been "staying here and there." He states he is homeless and that social security will not "release my check until I find somewhere to live." He reported previous inpatient hospitalization at [facility] and [facility].  Patient reported remote hx of cutting his wrist 10 years ago.   Patient reported a history of alcohol withdrawal seizures. His CIWA score is 4-5.   HPI Elements:   Location:  Alcohol use disorder, mood disorder. Quality:  severe, tremors, feelings of suicide and homicide, auditory/visual hallucination. Severity:  severe. Duration:  Chronic Alcoholism. Context:  Seeking MH treatment.  Past Medical History:  Past  Medical History  Diagnosis Date  . COPD (chronic obstructive pulmonary disease)   . Diabetes mellitus without complication   . Hypertension   . Coronary artery disease   . Arthritis   . Asthma   . Cancer   . CHF (congestive heart failure)   . Seizures   . Stroke   . Renal disorder   . Cirrhosis     Past Surgical History  Procedure Laterality Date  . Hernia repair    . Hemorrhoid surgery     Family History:  Family History  Problem Relation Age of Onset  . CVA Father   . Colon cancer Mother    Social History:  History  Alcohol Use  . 0.0 oz/week  . 0 Not specified per week    Comment: daily; liquor or wine; last drink yesterday     History  Drug Use No    History   Social History  . Marital Status: Legally Separated    Spouse Name: N/A    Number of Children: 0  . Years of Education: N/A   Social History Main Topics  . Smoking status: Current Some Day Smoker  . Smokeless tobacco: Not on file     Comment: He says that he quit two days ago.   . Alcohol Use: 0.0 oz/week    0 Not specified per week     Comment: daily; liquor or wine; last drink yesterday  . Drug Use: No  . Sexual Activity: Not on file   Other Topics  Concern  . Not on file   Social History Narrative   Lives alone.    Additional Social History:    Pain Medications: denies Prescriptions: denies Over the Counter: denies History of alcohol / drug use?: Yes Longest period of sobriety (when/how long): 3-4 weeks Negative Consequences of Use:  (medical problems) Withdrawal Symptoms: Sweats Name of Substance 1: alcohol 1 - Age of First Use: 27 1 - Amount (size/oz): a fifth  and a half of wine or liquor 1 - Frequency:  daily 1 - Duration: 2 years 1 - Last Use / Amount: yesterday  Allergies:   Allergies  Allergen Reactions  . Asa [Aspirin] Other (See Comments)    Heartburn  . Tylenol [Acetaminophen] Other (See Comments)    Can not take due to Liver issues    Vitals: Blood pressure  123/60, pulse 71, temperature 98.6 F (37 C), temperature source Oral, resp. rate 18, SpO2 96 %.  Risk to Self: Suicidal Ideation: Yes-Currently Present Suicidal Intent: Yes-Currently Present Is patient at risk for suicide?: Yes Suicidal Plan?: Yes-Currently Present Specify Current Suicidal Plan:  (run out in front of a car or shoot self) Access to Means: Yes Specify Access to Suicidal Means:  (environment) What has been your use of drugs/alcohol within the last 12 months?:  (saa SA section) How many times?: 1 Other Self Harm Risks:  (SA) Triggers for Past Attempts:  (depression) Intentional Self Injurious Behavior: None Risk to Others: Homicidal Ideation: Yes-Currently Present Thoughts of Harm to Others: Yes-Currently Present Comment - Thoughts of Harm to Others:  (shoot others who have done him wrong to "go down with him") Current Homicidal Intent: Yes-Currently Present Current Homicidal Plan: Yes-Currently Present Describe Current Homicidal Plan:  (shooting) Access to Homicidal Means: Yes ("I can get a gun") Identified Victim:  (freinds he has conflict with) History of harm to others?: Yes Assessment of Violence: In past 6-12 months Violent Behavior Description:  (fighting with others on the street) Does patient have access to weapons?: No ("I can get one") Criminal Charges Pending?: No Does patient have a court date: No Prior Inpatient Therapy: Prior Inpatient Therapy: Yes Prior Therapy Dates: doesn't remember Prior Therapy Facilty/Provider(s):  ([place]) Reason for Treatment:  (depression) Prior Outpatient Therapy: Prior Outpatient Therapy: Yes Prior Therapy Dates:  (pt can't remember) Prior Therapy Facilty/Provider(s):  (pt can't remember) Reason for Treatment:  (depression)  Current Facility-Administered Medications  Medication Dose Route Frequency Provider Last Rate Last Dose  . amLODipine (NORVASC) tablet 5 mg  5 mg Oral Daily Virgel Manifold, MD   5 mg at  05/30/14 1000  . budesonide-formoterol (SYMBICORT) 160-4.5 MCG/ACT inhaler 2 puff  2 puff Inhalation BID Virgel Manifold, MD   2 puff at 05/30/14 [phone]  . FLUoxetine (PROZAC) capsule 20 mg  20 mg Oral Daily Virgel Manifold, MD   20 mg at 05/30/14 1000  . ipratropium-albuterol (DUONEB) 0.5-2.5 (3) MG/3ML nebulizer solution 3 mL  3 mL Nebulization Q6H PRN Virgel Manifold, MD      . lactulose (CHRONULAC) 10 GM/15ML solution 10 g  10 g Oral Daily PRN Virgel Manifold, MD      . LORazepam (ATIVAN) tablet 0-4 mg  0-4 mg Oral 4 times per day Virgel Manifold, MD   1 mg at 05/30/14 1207   Followed by  . [START ON 05/31/2014] LORazepam (ATIVAN) tablet 0-4 mg  0-4 mg Oral Q12H Virgel Manifold, MD      . meloxicam [facility]) tablet 7.5 mg  7.5 mg Oral  Q breakfast Virgel Manifold, MD   7.5 mg at 05/30/14 0805  . multivitamin with minerals tablet 1 tablet  1 tablet Oral Daily Virgel Manifold, MD   1 tablet at 05/30/14 1034  . pantoprazole (PROTONIX) EC tablet 40 mg  40 mg Oral BID AC Virgel Manifold, MD   40 mg at 05/30/14 0806  . thiamine (VITAMIN B-1) tablet 100 mg  100 mg Oral Daily Virgel Manifold, MD   100 mg at 05/30/14 1034   Or  . thiamine (B-1) injection 100 mg  100 mg Intravenous Daily Virgel Manifold, MD      . traZODone (DESYREL) tablet 100 mg  100 mg Oral QHS PRN Virgel Manifold, MD       Current Outpatient Prescriptions  Medication Sig Dispense Refill  . acetaminophen (TYLENOL) 500 MG tablet Take 1 tablet (500 mg total) by mouth every 6 (six) hours as needed for moderate pain. 30 tablet 0  . albuterol-ipratropium (COMBIVENT) 18-103 MCG/ACT inhaler Inhale 1-2 puffs into the lungs every 6 (six) hours as needed for wheezing or shortness of breath (Use as rescue inhaler). 3 Inhaler 0  . amLODipine (NORVASC) 5 MG tablet Take 1 tablet (5 mg total) by mouth daily. 30 tablet 0  . budesonide-formoterol (SYMBICORT) 160-4.5 MCG/ACT inhaler Inhale 2 puffs into the lungs 2 (two) times daily. 3 Inhaler 0  . diclofenac sodium (VOLTAREN) 1  % GEL Apply 2 g topically 4 (four) times daily. 3 Tube 0  . FLUoxetine (PROZAC) 20 MG capsule Take 1 capsule (20 mg total) by mouth daily. 30 capsule 0  . folic acid (FOLVITE) 1 MG tablet Take 1 tablet (1 mg total) by mouth daily. 30 tablet 0  . lactulose (CHRONULAC) 10 GM/15ML solution Take 15 mLs (10 g total) by mouth daily as needed for mild constipation. 240 mL 0  . meloxicam (MOBIC) 7.5 MG tablet Take 1 tablet (7.5 mg total) by mouth daily with breakfast. 30 tablet 0  . Multiple Vitamin (MULTIVITAMIN WITH MINERALS) TABS tablet Take 1 tablet by mouth daily. 30 tablet 0  . pantoprazole (PROTONIX) 40 MG tablet Take 1 tablet (40 mg total) by mouth 2 (two) times daily before a meal. 60 tablet 0  . traZODone (DESYREL) 100 MG tablet Take 1 tablet (100 mg total) by mouth at bedtime as needed for sleep. 30 tablet 0    Musculoskeletal: Strength & Muscle Tone: Was assessed laying in bed Gait & Station: Was assessed laying in bed Patient leans: Was assessed laying in bed   Review of Systems  Constitutional: Negative.   HENT: Negative.   Eyes: Negative.   Respiratory: Negative.   Cardiovascular: Negative.   Gastrointestinal: Negative.   Genitourinary: Negative.   Skin: Negative.   Neurological: Negative.   Endo/Heme/Allergies: Negative.   Psychiatric/Behavioral: Positive for depression, suicidal ideas and hallucinations.    Psychiatric Specialty Exam:     Blood pressure 123/60, pulse 71, temperature 98.6 F (37 C), temperature source Oral, resp. rate 18, SpO2 96 %.There is no weight on file to calculate BMI.  General Appearance: Casual and Disheveled  Eye Contact::  None  Speech:  Clear and Coherent and Normal Rate  Volume:  Normal  Mood:  Anxious, Depressed and Hopeless  Affect:  Congruent and Depressed  Thought Process:  Coherent, Goal Directed and Intact  Orientation:  Full (Time, Place, and Person)  Thought Content:  Hallucinations: Auditory Visual  Suicidal Thoughts:  Yes.   with intent/plan  Homicidal Thoughts:  Yes.  with intent/plan  Memory:  Immediate;   Good Recent;   Good Remote;   Good  Judgement:  Poor  Insight:  Shallow  Psychomotor Activity:  Tremor  Concentration:  Good  Recall:  NA  Fund of Knowledge:Fair  Language: Good  Akathisia:  NA  Handed:  Right  AIMS (if indicated):     Assets:  Desire for Improvement  ADL's:  Impaired  Cognition: WNL  Sleep:      Medical Decision Making: Established Problem, Worsening (2)  Treatment Plan Summary: Daily contact with patient to assess and evaluate symptoms and progress in treatment and Medication management  Plan:  Seek inpatient placement at an appropriate [facility] facility.   Serena Colonel, FNP-BC [place] 05/30/2014 2:00 PM  Patient seen, evaluated and I agree with notes by Nurse Practitioner. Corena Pilgrim, MD

## 2014-05-30 NOTE — ED Notes (Addendum)
Treatment team at bedside

## 2014-05-30 NOTE — ED Notes (Signed)
Patient lying on his abd with head to the side. Pt resting quietly with eyes closed. Appears in no distress. Observed rise and fall of posterior chest. Pt recently got out of bed to urinate.

## 2014-05-31 DIAGNOSIS — F1020 Alcohol dependence, uncomplicated: Secondary | ICD-10-CM | POA: Diagnosis not present

## 2014-05-31 DIAGNOSIS — F10929 Alcohol use, unspecified with intoxication, unspecified: Secondary | ICD-10-CM | POA: Insufficient documentation

## 2014-05-31 MED ORDER — LORAZEPAM 2 MG PO TABS: 0.0000 mg | ORAL_TABLET | Freq: Four times a day (QID) | ORAL | Status: DC

## 2014-05-31 NOTE — Consult Note (Signed)
[facility] Face-to-Face Psychiatry discharge note   Reason for Consult: Alcohol use disorder, mood disorder Referring Physician:  EDP Patient Identification: Mitchell Molina MRN:  [id number] Principal Diagnosis: Alcohol use disorder, moderate, dependence Diagnosis:   Patient Active Problem List   Diagnosis Date Noted  . Alcohol use disorder, moderate, dependence [F10.20] 05/15/2014    Priority: High  . Severe recurrent major depressive disorder with psychotic features [F33.3] 05/15/2014  . Chest pain [R07.9]   . Suicidal ideation [R45.851]   . Hypokalemia [E87.6] 05/14/2014    Total Time spent with patient: 25 minutes  Subjective:   Mitchell Molina is a 74 y.o. male patient who stated " I had pains in my chest and started having suicide on my mind. My mother dead, my daddy dead, all my people dead."   HPI: Mitchell Molina is a 74 yo Caucasian male who is here for depression and suicidal ideation.  He has a history of alcohol abuse and depression. He states he has been admitted to [facility] in the past.  He reports he drink alcohol, a fifth to a fifth and a half a day. Today he endorses, homicidal ideation towards "people that have done me wrong;" he has no plan. He also endorses suicidal ideation with a plan to "shoot myself or run out in front of car."  He states he has no place to live and has been "staying here and there." He states he is homeless and that social security will not "release my check until I find somewhere to live." He reported previous inpatient hospitalization at [facility] and [facility].  Patient reported remote hx of cutting his wrist 10 years ago.   Patient reported a history of alcohol withdrawal seizures. His CIWA score is 4-5.   Note above reviewed with updates.  Patient denies SI/HI/AVH.  Patient has agreed to go home to a relative and look for his own housing.  Patient will be discharged with information for shelter and a referral to [facility].  HPI Elements:   Location:   Alcohol use disorder, mood disorder. Quality:  severe, tremors, feelings of suicide and homicide, auditory/visual hallucination. Severity:  severe. Duration:  Chronic Alcoholism. Context:  Seeking MH treatment.  Past Medical History:  Past Medical History  Diagnosis Date  . COPD (chronic obstructive pulmonary disease)   . Diabetes mellitus without complication   . Hypertension   . Coronary artery disease   . Arthritis   . Asthma   . Cancer   . CHF (congestive heart failure)   . Seizures   . Stroke   . Renal disorder   . Cirrhosis     Past Surgical History  Procedure Laterality Date  . Hernia repair    . Hemorrhoid surgery     Family History:  Family History  Problem Relation Age of Onset  . CVA Father   . Colon cancer Mother    Social History:  History  Alcohol Use  . 0.0 oz/week  . 0 Not specified per week    Comment: daily; liquor or wine; last drink yesterday     History  Drug Use No    History   Social History  . Marital Status: Legally Separated    Spouse Name: N/A    Number of Children: 0  . Years of Education: N/A   Social History Main Topics  . Smoking status: Current Some Day Smoker  . Smokeless tobacco: Not on file     Comment: He says that he quit  two days ago.   . Alcohol Use: 0.0 oz/week    0 Not specified per week     Comment: daily; liquor or wine; last drink yesterday  . Drug Use: No  . Sexual Activity: Not on file   Other Topics Concern  . Not on file   Social History Narrative   Lives alone.    Additional Social History:    Pain Medications: denies Prescriptions: denies Over the Counter: denies History of alcohol / drug use?: Yes Longest period of sobriety (when/how long): 3-4 weeks Negative Consequences of Use:  (medical problems) Withdrawal Symptoms: Sweats Name of Substance 1: alcohol 1 - Age of First Use: 27 1 - Amount (size/oz): a fifth  and a half of wine or liquor 1 - Frequency:  daily 1 - Duration: 2 years 1 -  Last Use / Amount: yesterday  Allergies:   Allergies  Allergen Reactions  . Asa [Aspirin] Other (See Comments)    Heartburn  . Tylenol [Acetaminophen] Other (See Comments)    Can not take due to Liver issues    Vitals: Blood pressure 133/60, pulse 66, temperature 98.3 F (36.8 C), temperature source Oral, resp. rate 20, SpO2 96 %.  Risk to Self: Suicidal Ideation: Yes-Currently Present Suicidal Intent: Yes-Currently Present Is patient at risk for suicide?: Yes Suicidal Plan?: Yes-Currently Present Specify Current Suicidal Plan:  (run out in front of a car or shoot self) Access to Means: Yes Specify Access to Suicidal Means:  (environment) What has been your use of drugs/alcohol within the last 12 months?:  (saa SA section) How many times?: 1 Other Self Harm Risks:  (SA) Triggers for Past Attempts:  (depression) Intentional Self Injurious Behavior: None Risk to Others: Homicidal Ideation: Yes-Currently Present Thoughts of Harm to Others: Yes-Currently Present Comment - Thoughts of Harm to Others:  (shoot others who have done him wrong to "go down with him") Current Homicidal Intent: Yes-Currently Present Current Homicidal Plan: Yes-Currently Present Describe Current Homicidal Plan:  (shooting) Access to Homicidal Means: Yes ("I can get a gun") Identified Victim:  (freinds he has conflict with) History of harm to others?: Yes Assessment of Violence: In past 6-12 months Violent Behavior Description:  (fighting with others on the street) Does patient have access to weapons?: No ("I can get one") Criminal Charges Pending?: No Does patient have a court date: No Prior Inpatient Therapy: Prior Inpatient Therapy: Yes Prior Therapy Dates: doesn't remember Prior Therapy Facilty/Provider(s):  ([place]) Reason for Treatment:  (depression) Prior Outpatient Therapy: Prior Outpatient Therapy: Yes Prior Therapy Dates:  (pt can't remember) Prior Therapy Facilty/Provider(s):  (pt  can't remember) Reason for Treatment:  (depression)  Current Facility-Administered Medications  Medication Dose Route Frequency Provider Last Rate Last Dose  . amLODipine (NORVASC) tablet 5 mg  5 mg Oral Daily Virgel Manifold, MD   5 mg at 05/31/14 0850  . budesonide-formoterol (SYMBICORT) 160-4.5 MCG/ACT inhaler 2 puff  2 puff Inhalation BID Virgel Manifold, MD   2 puff at 05/31/14 [phone]  . FLUoxetine (PROZAC) capsule 20 mg  20 mg Oral Daily Virgel Manifold, MD   20 mg at 05/31/14 0850  . ipratropium-albuterol (DUONEB) 0.5-2.5 (3) MG/3ML nebulizer solution 3 mL  3 mL Nebulization Q6H PRN Virgel Manifold, MD      . lactulose (CHRONULAC) 10 GM/15ML solution 10 g  10 g Oral Daily PRN Virgel Manifold, MD      . LORazepam (ATIVAN) tablet 0-4 mg  0-4 mg Oral Q12H Virgel Manifold, MD  0 mg at 05/31/14 0853  . meloxicam (MOBIC) tablet 7.5 mg  7.5 mg Oral Q breakfast Virgel Manifold, MD   7.5 mg at 05/31/14 0818  . multivitamin with minerals tablet 1 tablet  1 tablet Oral Daily Virgel Manifold, MD   1 tablet at 05/31/14 0850  . pantoprazole (PROTONIX) EC tablet 40 mg  40 mg Oral BID AC Virgel Manifold, MD   40 mg at 05/31/14 0818  . thiamine (VITAMIN B-1) tablet 100 mg  100 mg Oral Daily Virgel Manifold, MD   100 mg at 05/31/14 0160   Or  . thiamine (B-1) injection 100 mg  100 mg Intravenous Daily Virgel Manifold, MD      . traZODone (DESYREL) tablet 100 mg  100 mg Oral QHS PRN Virgel Manifold, MD       Current Outpatient Prescriptions  Medication Sig Dispense Refill  . acetaminophen (TYLENOL) 500 MG tablet Take 1 tablet (500 mg total) by mouth every 6 (six) hours as needed for moderate pain. 30 tablet 0  . albuterol-ipratropium (COMBIVENT) 18-103 MCG/ACT inhaler Inhale 1-2 puffs into the lungs every 6 (six) hours as needed for wheezing or shortness of breath (Use as rescue inhaler). 3 Inhaler 0  . amLODipine (NORVASC) 5 MG tablet Take 1 tablet (5 mg total) by mouth daily. 30 tablet 0  . budesonide-formoterol (SYMBICORT)  160-4.5 MCG/ACT inhaler Inhale 2 puffs into the lungs 2 (two) times daily. 3 Inhaler 0  . diclofenac sodium (VOLTAREN) 1 % GEL Apply 2 g topically 4 (four) times daily. 3 Tube 0  . FLUoxetine (PROZAC) 20 MG capsule Take 1 capsule (20 mg total) by mouth daily. 30 capsule 0  . folic acid (FOLVITE) 1 MG tablet Take 1 tablet (1 mg total) by mouth daily. 30 tablet 0  . lactulose (CHRONULAC) 10 GM/15ML solution Take 15 mLs (10 g total) by mouth daily as needed for mild constipation. 240 mL 0  . meloxicam (MOBIC) 7.5 MG tablet Take 1 tablet (7.5 mg total) by mouth daily with breakfast. 30 tablet 0  . Multiple Vitamin (MULTIVITAMIN WITH MINERALS) TABS tablet Take 1 tablet by mouth daily. 30 tablet 0  . pantoprazole (PROTONIX) 40 MG tablet Take 1 tablet (40 mg total) by mouth 2 (two) times daily before a meal. 60 tablet 0  . traZODone (DESYREL) 100 MG tablet Take 1 tablet (100 mg total) by mouth at bedtime as needed for sleep. 30 tablet 0    Musculoskeletal: Strength & Muscle Tone: Was assessed laying in bed Gait & Station: Was assessed laying in bed Patient leans: Was assessed laying in bed   ROS  Psychiatric Specialty Exam:     Blood pressure 133/60, pulse 66, temperature 98.3 F (36.8 C), temperature source Oral, resp. rate 20, SpO2 96 %.There is no weight on file to calculate BMI.  General Appearance: Casual and Disheveled  Eye Contact::  None  Speech:  Clear and Coherent and Normal Rate  Volume:  Normal  Mood:  Anxious, Depressed and Hopeless  Affect:  Congruent and Depressed  Thought Process:  Coherent, Goal Directed and Intact  Orientation:  Full (Time, Place, and Person)  Thought Content:  WDL  Suicidal Thoughts:  No  Homicidal Thoughts:  No  Memory:  Immediate;   Good Recent;   Good Remote;   Good  Judgement:  Fair  Insight:  Fair  Psychomotor Activity:  Normal  Concentration:  Good  Recall:  NA  Fund of Knowledge:Fair  Language: Good  Akathisia:  NA  Handed:  Right   AIMS (if indicated):     Assets:  Desire for Improvement  ADL's:  Impaired  Cognition: WNL  Sleep:      Medical Decision Making: Established Problem, Stable/Improving (1)  Treatment Plan Summary: Discharge honme  Plan:  Discharge home  Charmaine Downs  PMHNP-BC [place] 05/31/2014 10:11 AM   Patient seen, evaluated and I agree with notes by Nurse Practitioner. Corena Pilgrim, MD

## 2014-05-31 NOTE — Discharge Instructions (Signed)
Alcohol Intoxication Alcohol intoxication occurs when the amount of alcohol that a person has consumed impairs his or her ability to mentally and physically function. Alcohol directly impairs the normal chemical activity of the brain. Drinking large amounts of alcohol can lead to changes in mental function and behavior, and it can cause many physical effects that can be harmful.  Alcohol intoxication can range in severity from mild to very severe. Various factors can affect the level of intoxication that occurs, such as the person's age, gender, weight, frequency of alcohol consumption, and the presence of other medical conditions (such as diabetes, seizures, or heart conditions). Dangerous levels of alcohol intoxication may occur when people drink large amounts of alcohol in a short period (binge drinking). Alcohol can also be especially dangerous when combined with certain prescription medicines or "recreational" drugs. SIGNS AND SYMPTOMS Some common signs and symptoms of mild alcohol intoxication include:  Loss of coordination.  Changes in mood and behavior.  Impaired judgment.  Slurred speech. As alcohol intoxication progresses to more severe levels, other signs and symptoms will appear. These may include:  Vomiting.  Confusion and impaired memory.  Slowed breathing.  Seizures.  Loss of consciousness. DIAGNOSIS  Your health care provider will take a medical history and perform a physical exam. You will be asked about the amount and type of alcohol you have consumed. Blood tests will be done to measure the concentration of alcohol in your blood. In many places, your blood alcohol level must be lower than 80 mg/dL (0.08%) to legally drive. However, many dangerous effects of alcohol can occur at much lower levels.  TREATMENT  People with alcohol intoxication often do not require treatment. Most of the effects of alcohol intoxication are temporary, and they go away as the alcohol naturally  leaves the body. Your health care provider will monitor your condition until you are stable enough to go home. Fluids are sometimes given through an IV access tube to help prevent dehydration.  HOME CARE INSTRUCTIONS  Do not drive after drinking alcohol.  Stay hydrated. Drink enough water and fluids to keep your urine clear or pale yellow. Avoid caffeine.   Only take over-the-counter or prescription medicines as directed by your health care provider.  SEEK MEDICAL CARE IF:   You have persistent vomiting.   You do not feel better after a few days.  You have frequent alcohol intoxication. Your health care provider can help determine if you should see a substance use treatment counselor. SEEK IMMEDIATE MEDICAL CARE IF:   You become shaky or tremble when you try to stop drinking.   You shake uncontrollably (seizure).   You throw up (vomit) blood. This may be bright red or may look like black coffee grounds.   You have blood in your stool. This may be bright red or may appear as a black, tarry, bad smelling stool.   You become lightheaded or faint.  MAKE SURE YOU:   Understand these instructions.  Will watch your condition.  Will get help right away if you are not doing well or get worse. Document Released: 01/16/2005 Document Revised: 12/09/2012 Document Reviewed: 09/11/2012 [facility] Patient Information 2015 [place], [place]. This information is not intended to replace advice given to you by your health care provider. Make sure you discuss any questions you have with your health care provider.  Chest Pain (Nonspecific) It is often hard to give a specific diagnosis for the cause of chest pain. There is always a chance that your pain  could be related to something serious, such as a heart attack or a blood clot in the lungs. You need to follow up with your health care provider for further evaluation. CAUSES   Heartburn.  Pneumonia or bronchitis.  Anxiety or  stress.  Inflammation around your heart (pericarditis) or lung (pleuritis or pleurisy).  A blood clot in the lung.  A collapsed lung (pneumothorax). It can develop suddenly on its own (spontaneous pneumothorax) or from trauma to the chest.  Shingles infection (herpes zoster virus). The chest wall is composed of bones, muscles, and cartilage. Any of these can be the source of the pain.  The bones can be bruised by injury.  The muscles or cartilage can be strained by coughing or overwork.  The cartilage can be affected by inflammation and become sore (costochondritis). DIAGNOSIS  Lab tests or other studies may be needed to find the cause of your pain. Your health care provider may have you take a test called an ambulatory electrocardiogram (ECG). An ECG records your heartbeat patterns over a 24-hour period. You may also have other tests, such as:  Transthoracic echocardiogram (TTE). During echocardiography, sound waves are used to evaluate how blood flows through your heart.  Transesophageal echocardiogram (TEE).  Cardiac monitoring. This allows your health care provider to monitor your heart rate and rhythm in real time.  Holter monitor. This is a portable device that records your heartbeat and can help diagnose heart arrhythmias. It allows your health care provider to track your heart activity for several days, if needed.  Stress tests by exercise or by giving medicine that makes the heart beat faster. TREATMENT   Treatment depends on what may be causing your chest pain. Treatment may include:  Acid blockers for heartburn.  Anti-inflammatory medicine.  Pain medicine for inflammatory conditions.  Antibiotics if an infection is present.  You may be advised to change lifestyle habits. This includes stopping smoking and avoiding alcohol, caffeine, and chocolate.  You may be advised to keep your head raised (elevated) when sleeping. This reduces the chance of acid going backward  from your stomach into your esophagus. Most of the time, nonspecific chest pain will improve within 2-3 days with rest and mild pain medicine.  HOME CARE INSTRUCTIONS   If antibiotics were prescribed, take them as directed. Finish them even if you start to feel better.  For the next few days, avoid physical activities that bring on chest pain. Continue physical activities as directed.  Do not use any tobacco products, including cigarettes, chewing tobacco, or electronic cigarettes.  Avoid drinking alcohol.  Only take medicine as directed by your health care provider.  Follow your health care provider's suggestions for further testing if your chest pain does not go away.  Keep any follow-up appointments you made. If you do not go to an appointment, you could develop lasting (chronic) problems with pain. If there is any problem keeping an appointment, call to reschedule. SEEK MEDICAL CARE IF:   Your chest pain does not go away, even after treatment.  You have a rash with blisters on your chest.  You have a fever. SEEK IMMEDIATE MEDICAL CARE IF:   You have increased chest pain or pain that spreads to your arm, neck, jaw, back, or abdomen.  You have shortness of breath.  You have an increasing cough, or you cough up blood.  You have severe back or abdominal pain.  You feel nauseous or vomit.  You have severe weakness.  You faint.  You have chills. This is an emergency. Do not wait to see if the pain will go away. Get medical help at once. Call your local emergency services (911 in [place] Do not drive yourself to the hospital. MAKE SURE YOU:   Understand these instructions.  Will watch your condition.  Will get help right away if you are not doing well or get worse. Document Released: 01/16/2005 Document Revised: 04/13/2013 Document Reviewed: 11/12/2007 [facility] Patient Information 2015 [place], [place]. This information is not intended to replace advice given to you by  your health care provider. Make sure you discuss any questions you have with your health care provider.   For your ongoing mental health needs, contact [facility].  They provide outpatient psychiatry/medication management, as well as counseling:       [facility]      [place], [place] [address]       [phone]  To help you maintain a sober lifestyle, a substance abuse treatment program may be beneficial to you.  [facility] offers outpatient substance abuse treatment.  They also offer transitional housing for people whose living environment is unstable:       [facility]      [address]      [place], [place] [address]      [phone]

## 2014-05-31 NOTE — ED Notes (Signed)
Belongings given to [facility]

## 2014-05-31 NOTE — Progress Notes (Signed)
pcp is [facility] [place], [place] [address] [phone]

## 2014-05-31 NOTE — [facility] Suicide Risk Assessment (Cosign Needed)
Suicide Risk Assessment  Discharge Assessment   [facility] Discharge Suicide Risk Assessment   Demographic Factors:  Age 74 or older, Caucasian, Low socioeconomic status, Living alone and Unemployed  Total Time spent with patient: 20 minutes  Musculoskeletal: Strength & Muscle Tone: within normal limits Gait & Station: normal Patient leans: N/A  Psychiatric Specialty Exam:     Blood pressure 133/60, pulse 66, temperature 98.3 F (36.8 C), temperature source Oral, resp. rate 20, SpO2 96 %.There is no weight on file to calculate BMI.  General Appearance: Casual and Disheveled  Eye Contact::  Fair  Speech:  Clear and Coherent and Normal Rate409  Volume:  Normal  Mood:  Depressed  Affect:  Congruent and Depressed  Thought Process:  Coherent, Goal Directed and Intact  Orientation:  Full (Time, Place, and Person)  Thought Content:  WDL  Suicidal Thoughts:  No  Homicidal Thoughts:  No  Memory:  Immediate;   Good Recent;   Good Remote;   Fair  Judgement:  Fair  Insight:  Fair  Psychomotor Activity:  Normal  Concentration:  Good  Recall:  NA  Fund of Knowledge:Fair  Language: Good  Akathisia:  NA  Handed:  Right  AIMS (if indicated):     Assets:  Desire for Improvement  Sleep:     Cognition: WNL  ADL's:  Impaired      Has this patient used any form of tobacco in the last 30 days? (Cigarettes, Smokeless Tobacco, Cigars, and/or Pipes) Yes, A prescription for an FDA-approved tobacco cessation medication was offered at discharge and the patient refused  Mental Status Per Nursing Assessment::   On Admission:     Current Mental Status by Physician: NA  Loss Factors: NA  Historical Factors: Prior suicide attempts  Risk Reduction Factors:   Living with another person, especially a relative  Continued Clinical Symptoms:  Depression:   Insomnia Alcohol/Substance Abuse/Dependencies  Cognitive Features That Contribute To Risk:  Polarized thinking    Suicide Risk:   Minimal: No identifiable suicidal ideation.  Patients presenting with no risk factors but with morbid ruminations; may be classified as minimal risk based on the severity of the depressive symptoms  Principal Problem: Alcohol use disorder, moderate, dependence Discharge Diagnoses:  Patient Active Problem List   Diagnosis Date Noted  . Alcohol use disorder, moderate, dependence [F10.20] 05/15/2014    Priority: High  . Alcohol intoxication [F10.129]   . Severe recurrent major depressive disorder with psychotic features [F33.3] 05/15/2014  . Chest pain [R07.9]   . Suicidal ideation [R45.851]   . Hypokalemia [E87.6] 05/14/2014    Follow-up Information    Follow up with [facility].   Specialty:  Emergency Medicine   Why:  As needed   Contact information:   [place] [id number] [place] [phone]      Follow up with [facility]    .   Contact information:   [place] [address] [phone]      Plan Of Care/Follow-up recommendations:  Activity:  as tolerated Diet:  Regular  Is patient on multiple antipsychotic therapies at discharge:  No   Has Patient had three or more failed trials of antipsychotic monotherapy by history:  No  Recommended Plan for Multiple Antipsychotic Therapies: NA    Ilaria Much, C   PMHNP-BC 05/31/2014, 10:23 AM

## 2014-05-31 NOTE — BH Assessment (Signed)
[facility] Assessment Progress Note  Per Corena Pilgrim, MD this pt no longer requires psychiatric hospitalization.  He is to be discharged from [facility] with outpatient referrals.  Included in his discharge instructions is contact information for [facility] for psychiatry and therapy, and to [facility] for substance abuse treatment and transitional housing.  Pt has declined homeless shelter information, indicating that he has a cousin in [place] that he believes will provide interim housing for him.  Jalene Mullet, MA Triage Specialist 05/31/2014 @ 12:58

## 2014-05-31 NOTE — ED Notes (Signed)
MD at bedside. 

## 2014-06-04 ENCOUNTER — Emergency Department (HOSPITAL_COMMUNITY): Payer: Medicare Other

## 2014-06-04 ENCOUNTER — Emergency Department (HOSPITAL_COMMUNITY)
Admission: EM | Admit: 2014-06-04 | Discharge: 2014-06-06 | Disposition: A | Discharge status: Home / Self Care | Payer: Medicare Other | Attending: Emergency Medicine | Admitting: Emergency Medicine

## 2014-06-04 ENCOUNTER — Encounter (HOSPITAL_COMMUNITY): Payer: Self-pay

## 2014-06-04 DIAGNOSIS — R059 Cough, unspecified: Secondary | ICD-10-CM

## 2014-06-04 DIAGNOSIS — Z5900 Homelessness unspecified: Secondary | ICD-10-CM

## 2014-06-04 DIAGNOSIS — Y9389 Activity, other specified: Secondary | ICD-10-CM | POA: Diagnosis not present

## 2014-06-04 DIAGNOSIS — Z87891 Personal history of nicotine dependence: Secondary | ICD-10-CM | POA: Insufficient documentation

## 2014-06-04 DIAGNOSIS — F10120 Alcohol abuse with intoxication, uncomplicated: Secondary | ICD-10-CM | POA: Insufficient documentation

## 2014-06-04 DIAGNOSIS — R062 Wheezing: Secondary | ICD-10-CM | POA: Diagnosis not present

## 2014-06-04 DIAGNOSIS — R55 Syncope and collapse: Secondary | ICD-10-CM | POA: Insufficient documentation

## 2014-06-04 DIAGNOSIS — I252 Old myocardial infarction: Secondary | ICD-10-CM | POA: Insufficient documentation

## 2014-06-04 DIAGNOSIS — R45851 Suicidal ideations: Secondary | ICD-10-CM

## 2014-06-04 DIAGNOSIS — Y92511 Restaurant or cafe as the place of occurrence of the external cause: Secondary | ICD-10-CM | POA: Insufficient documentation

## 2014-06-04 DIAGNOSIS — Y998 Other external cause status: Secondary | ICD-10-CM | POA: Diagnosis not present

## 2014-06-04 DIAGNOSIS — W19XXXA Unspecified fall, initial encounter: Secondary | ICD-10-CM | POA: Insufficient documentation

## 2014-06-04 DIAGNOSIS — F10920 Alcohol use, unspecified with intoxication, uncomplicated: Secondary | ICD-10-CM

## 2014-06-04 DIAGNOSIS — Z043 Encounter for examination and observation following other accident: Secondary | ICD-10-CM | POA: Insufficient documentation

## 2014-06-04 DIAGNOSIS — R05 Cough: Secondary | ICD-10-CM

## 2014-06-04 HISTORY — DX: Acute myocardial infarction, unspecified: I21.9

## 2014-06-04 HISTORY — DX: Other ascites: R18.8

## 2014-06-04 LAB — COMPREHENSIVE METABOLIC PANEL
ALBUMIN: 3.9 g/dL (ref 3.5–5.2)
ALK PHOS: 78 U/L (ref 39–117)
ALT: 22 U/L (ref 0–53)
AST: 20 U/L (ref 0–37)
Anion gap: 14 (ref 5–15)
BUN: <5 mg/dL — ABNORMAL LOW (ref 6–23)
CALCIUM: 8.7 mg/dL (ref 8.4–10.5)
CO2: 22 mmol/L (ref 19–32)
Chloride: 101 mmol/L (ref 96–112)
Creatinine, Ser: 0.61 mg/dL (ref 0.50–1.35)
GFR calc Af Amer: >90 mL/min (ref >90)
Glucose, Bld: 167 mg/dL — ABNORMAL HIGH (ref 70–99)
POTASSIUM: 3.4 mmol/L — AB (ref 3.5–5.1)
SODIUM: 137 mmol/L (ref 135–145)
Total Bilirubin: 0.8 mg/dL (ref 0.3–1.2)
Total Protein: 6.5 g/dL (ref 6.0–8.3)

## 2014-06-04 LAB — I-STAT CHEM 8, ED
BUN: 6 mg/dL (ref 6–23)
Calcium, Ion: 1.08 mmol/L — ABNORMAL LOW (ref 1.13–1.30)
Chloride: 100 mmol/L (ref 96–112)
Creatinine, Ser: 0.8 mg/dL (ref 0.50–1.35)
Glucose, Bld: 166 mg/dL — ABNORMAL HIGH (ref 70–99)
HCT: 39 % (ref 39.0–52.0)
Hemoglobin: 13.3 g/dL (ref 13.0–17.0)
POTASSIUM: 3.5 mmol/L (ref 3.5–5.1)
Sodium: 138 mmol/L (ref 135–145)
TCO2: 21 mmol/L (ref 0–100)

## 2014-06-04 LAB — PROTIME-INR
INR: 0.94 (ref 0.00–1.49)
PROTHROMBIN TIME: 12.7 s (ref 11.6–15.2)

## 2014-06-04 LAB — CBC
HCT: 36.2 % — ABNORMAL LOW (ref 39.0–52.0)
Hemoglobin: 12 g/dL — ABNORMAL LOW (ref 13.0–17.0)
MCH: 27.3 pg (ref 26.0–34.0)
MCHC: 33.1 g/dL (ref 30.0–36.0)
MCV: 82.3 fL (ref 78.0–100.0)
PLATELETS: 205 10*3/uL (ref 150–400)
RBC: 4.4 MIL/uL (ref 4.22–5.81)
RDW: 14.2 % (ref 11.5–15.5)
WBC: 4.4 10*3/uL (ref 4.0–10.5)

## 2014-06-04 LAB — RAPID URINE DRUG SCREEN, HOSP PERFORMED
Amphetamines: NOT DETECTED
BARBITURATES: NOT DETECTED
BENZODIAZEPINES: NOT DETECTED
COCAINE: NOT DETECTED
Opiates: NOT DETECTED
TETRAHYDROCANNABINOL: NOT DETECTED

## 2014-06-04 LAB — DIFFERENTIAL
Basophils Absolute: 0 10*3/uL (ref 0.0–0.1)
Basophils Relative: 0 % (ref 0–1)
EOS PCT: 1 % (ref 0–5)
Eosinophils Absolute: 0.1 10*3/uL (ref 0.0–0.7)
Lymphocytes Relative: 18 % (ref 12–46)
Lymphs Abs: 0.8 10*3/uL (ref 0.7–4.0)
Monocytes Absolute: 0.2 10*3/uL (ref 0.1–1.0)
Monocytes Relative: 5 % (ref 3–12)
NEUTROS PCT: 76 % (ref 43–77)
Neutro Abs: 3.3 10*3/uL (ref 1.7–7.7)

## 2014-06-04 LAB — URINALYSIS, ROUTINE W REFLEX MICROSCOPIC
Bilirubin Urine: NEGATIVE
GLUCOSE, UA: NEGATIVE mg/dL
HGB URINE DIPSTICK: NEGATIVE
KETONES UR: NEGATIVE mg/dL
Leukocytes, UA: NEGATIVE
Nitrite: NEGATIVE
PROTEIN: NEGATIVE mg/dL
Specific Gravity, Urine: 1.007 (ref 1.005–1.030)
Urobilinogen, UA: 1 mg/dL (ref 0.0–1.0)
pH: 5 (ref 5.0–8.0)

## 2014-06-04 LAB — APTT: APTT: 29 s (ref 24–37)

## 2014-06-04 LAB — ETHANOL: ALCOHOL ETHYL (B): 158 mg/dL — AB (ref 0–9)

## 2014-06-04 LAB — I-STAT TROPONIN, ED: TROPONIN I, POC: 0 ng/mL (ref 0.00–0.08)

## 2014-06-04 LAB — AMMONIA: Ammonia: 51 umol/L — ABNORMAL HIGH (ref 11–32)

## 2014-06-04 MED ORDER — ZOLPIDEM TARTRATE 5 MG PO TABS: 5.0000 mg | ORAL_TABLET | Freq: Every evening | ORAL | Status: DC | PRN

## 2014-06-04 MED ORDER — SODIUM CHLORIDE 0.9 % IV BOLUS (SEPSIS)
1000.0000 mL | Freq: Once | INTRAVENOUS | Status: AC
  Administered 2014-06-04: 1000 mL via INTRAVENOUS

## 2014-06-04 MED ORDER — ONDANSETRON HCL 4 MG PO TABS
4.0000 mg | ORAL_TABLET | Freq: Three times a day (TID) | ORAL | Status: DC | PRN
  Administered 2014-06-05: 4 mg via ORAL
  Filled 2014-06-04: qty 1

## 2014-06-04 MED ORDER — LORAZEPAM 1 MG PO TABS
1.0000 mg | ORAL_TABLET | Freq: Three times a day (TID) | ORAL | Status: DC | PRN
  Administered 2014-06-05: 1 mg via ORAL
  Filled 2014-06-04: qty 1

## 2014-06-04 MED ORDER — KETAMINE HCL 10 MG/ML IJ SOLN
0.5000 mg/kg | Freq: Once | INTRAMUSCULAR | Status: DC
  Filled 2014-06-04: qty 5.2

## 2014-06-04 NOTE — ED Notes (Signed)
Never started the sedation, it was not given.  The CT tech wanted to try and scan him without the medication and was able to do it without medicating him.

## 2014-06-04 NOTE — ED Notes (Addendum)
Pt. Refused to have ccollar placed at this time. Refusing to lay on back.

## 2014-06-04 NOTE — ED Notes (Signed)
Dr. Jeanell Sparrow advised me that she mentioned to the patient that he was going to be discharged and he could go to a shelter.  The patient then told Dr. Jeanell Sparrow he felt suicidal.  Dr. Jeanell Sparrow is ordering a telepsych consult for him.  I inform the charge nurse of same and called staffing for a sitter.

## 2014-06-04 NOTE — ED Notes (Signed)
Patient transported to CT 

## 2014-06-04 NOTE — ED Provider Notes (Addendum)
CSN: [id number]     Arrival date & time 06/04/14  1624 History   First MD Initiated Contact with Patient 06/04/14 1652     Chief Complaint  Patient presents with  . Loss of Consciousness    Level five caveat-intoxication/ amnesia for event uncooperative. (Consider location/radiation/quality/duration/timing/severity/associated sxs/prior Treatment) HPI 74 y.o. Male with h.o. etoh abuse presents with etoh intoxication and report of a fall.  He was at a [employer] and reportedly fell face first.  Further information not able to be obtained as patient refuses to answer questions.  Past Medical History  Diagnosis Date  . Cirrhosis   . Ascites   . Seizures   . MI (myocardial infarction)    Past Surgical History  Procedure Laterality Date  . Coronary stent placement     No family history on file. History  Substance Use Topics  . Smoking status: Former Research scientist (life sciences)  . Smokeless tobacco: Not on file  . Alcohol Use: Yes    Review of Systems  Unable to perform ROS     Allergies  Review of patient's allergies indicates no known allergies.  Home Medications   Prior to Admission medications   Not on File   BP 101/51 mmHg  Pulse 76  Temp(Src) 98.4 F (36.9 C) (Oral)  Resp 15  SpO2 95% Physical Exam  Constitutional: He is oriented to person, place, and time. He appears well-developed and well-nourished.  HENT:  Head: Normocephalic and atraumatic.  Mouth/Throat: Oropharynx is clear and moist.  Eyes: Conjunctivae and EOM are normal. Pupils are equal, round, and reactive to light.  Neck: Normal range of motion. Neck supple.  Cardiovascular: Normal rate and regular rhythm.   Pulmonary/Chest: Effort normal and breath sounds normal.  Abdominal: Soft. Bowel sounds are normal. He exhibits distension.  Musculoskeletal: Normal range of motion.  Neurological: He is alert and oriented to person, place, and time. He has normal reflexes.  Skin: Skin is warm.  Psychiatric:   Patient states suicidal and would shoot self if he had a gun  Nursing note and vitals reviewed.   ED Course  Procedures (including critical care time) Labs Review Labs Reviewed  ETHANOL - Abnormal; Notable for the following:    Alcohol, Ethyl (B) 158 (*)    All other components within normal limits  CBC - Abnormal; Notable for the following:    Hemoglobin 12.0 (*)    HCT 36.2 (*)    All other components within normal limits  COMPREHENSIVE METABOLIC PANEL - Abnormal; Notable for the following:    Potassium 3.4 (*)    Glucose, Bld 167 (*)    BUN <5 (*)    All other components within normal limits  AMMONIA - Abnormal; Notable for the following:    Ammonia 51 (*)    All other components within normal limits  I-STAT CHEM 8, ED - Abnormal; Notable for the following:    Glucose, Bld 166 (*)    Calcium, Ion 1.08 (*)    All other components within normal limits  PROTIME-INR  APTT  DIFFERENTIAL  URINE RAPID DRUG SCREEN (HOSP PERFORMED)  URINALYSIS, ROUTINE W REFLEX MICROSCOPIC  I-STAT TROPOININ, ED  I-STAT TROPOININ, ED    Imaging Review No results found.   EKG Interpretation None      MDM   Final diagnoses:  Fall, initial encounter  Alcohol intoxication, uncomplicated  Suicidal ideation      1- etoh intoxication- patient has elevated etoh at 158.  Patient without s/s of  withdrawal. Patient awake and alert now.  2- fall- unclear if fall or loc. Workup here w/out signs of ischemia or head injury.  CT head neck, and maxillofacial done without acute abnormality  3- suicidal- will obtain consult.   Shaune Pollack, MD 06/04/14 2321  Called by tts who state he meets inpatient criteria but they do not have a bed.  They will look for other beds.   Shaune Pollack, MD 06/05/14 [phone]

## 2014-06-04 NOTE — ED Notes (Signed)
Patient is resting comfortably. 

## 2014-06-04 NOTE — ED Notes (Signed)
Patient asleep, sitter at the bedside.

## 2014-06-04 NOTE — ED Notes (Signed)
Pt. Asleep and snoring, O2 sat 86%. Placed on 2L [place], O2 sat 100%

## 2014-06-04 NOTE — ED Notes (Signed)
Pt. Now endorsing SI thoughts. States he would shot himself with his gun. When questioned further, patient states that he does not own a gun but would steal one. Due to patient confusion and intoxication at this time, will carefully watch patient and will reassess SI later.

## 2014-06-04 NOTE — ED Notes (Signed)
Per EMS, patient was a [employer] when he stood up and per bystanders he "faceplanted", no seizure-like activity reported but patient has hx of seizure with non-compliance of meds. Per EMS, patient c/o pain to back of head and low back. EMS reports ETOH on board. Pt. Agitated and confused on arrival
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
## 2014-06-04 NOTE — BH Assessment (Signed)
2245:  Consulted with Dr. Jeanell Sparrow about the Patient.  Dr. Jeanell Sparrow reports Patient has a history of alcohol abuse and was drinking today and fell or passed out.  She reports the Patient reports being suicidal with a plan to shot himself with a gun.    2255:  Assess Patient.  2325:  Consult with Darlyne Russian, PA about the Patient:  Per Darlyne Russian; the Patient meets inpatient criteria, no beds available at [facility], seek outside placement.    2327:  Patient's disposition provided to Dr. Jeanell Sparrow.

## 2014-06-04 NOTE — ED Notes (Signed)
Patient transported to E-47 from CT.

## 2014-06-05 ENCOUNTER — Emergency Department (HOSPITAL_COMMUNITY): Payer: Medicare Other

## 2014-06-05 DIAGNOSIS — F10120 Alcohol abuse with intoxication, uncomplicated: Secondary | ICD-10-CM | POA: Diagnosis not present

## 2014-06-05 MED ORDER — VITAMIN B-1 100 MG PO TABS
100.0000 mg | ORAL_TABLET | Freq: Every day | ORAL | Status: DC
  Administered 2014-06-06: 100 mg via ORAL
  Filled 2014-06-05: qty 1

## 2014-06-05 MED ORDER — LORAZEPAM 1 MG PO TABS: 0.0000 mg | ORAL_TABLET | Freq: Two times a day (BID) | ORAL | Status: DC

## 2014-06-05 MED ORDER — LORAZEPAM 2 MG/ML IJ SOLN
2.0000 mg | Freq: Once | INTRAMUSCULAR | Status: AC
  Administered 2014-06-05: 2 mg via INTRAVENOUS
  Filled 2014-06-05: qty 1

## 2014-06-05 MED ORDER — ALBUTEROL SULFATE HFA 108 (90 BASE) MCG/ACT IN AERS
2.0000 | INHALATION_SPRAY | RESPIRATORY_TRACT | Status: DC | PRN
  Administered 2014-06-05: 2 via RESPIRATORY_TRACT
  Filled 2014-06-05: qty 6.7

## 2014-06-05 MED ORDER — THIAMINE HCL 100 MG/ML IJ SOLN
100.0000 mg | Freq: Every day | INTRAMUSCULAR | Status: DC
  Administered 2014-06-05: 100 mg via INTRAVENOUS
  Filled 2014-06-05: qty 2

## 2014-06-05 MED ORDER — LORAZEPAM 1 MG PO TABS
0.0000 mg | ORAL_TABLET | Freq: Four times a day (QID) | ORAL | Status: DC
  Administered 2014-06-05: 1 mg via ORAL
  Administered 2014-06-05: 2 mg via ORAL
  Administered 2014-06-06: 1 mg via ORAL
  Filled 2014-06-05: qty 1
  Filled 2014-06-05: qty 2
  Filled 2014-06-05: qty 1

## 2014-06-05 MED ORDER — SODIUM CHLORIDE 0.9 % IV BOLUS (SEPSIS)
1000.0000 mL | Freq: Once | INTRAVENOUS | Status: AC
  Administered 2014-06-05: 1000 mL via INTRAVENOUS

## 2014-06-05 NOTE — BH Assessment (Signed)
Pt meets inpatient criteria and has been referred to the following facilities: [facility] (at capacity), [facility], and [facility].

## 2014-06-05 NOTE — ED Notes (Signed)
Dr Doy Mince in w/pt.

## 2014-06-05 NOTE — ED Notes (Signed)
TTS at bedside. 

## 2014-06-05 NOTE — Progress Notes (Addendum)
CSW continued inpatient placement referrals and made phone contact with Frankey Poot, CSW at [facility].   CSW made referrrals to:  Faxed For Placement: [facility]   Patient is also pending for placement at: [facility]  CSW will continue inpatient placement attempts.  [facility] Facilities at Capacity: [place], [place] Disposition Social Worker [phone]

## 2014-06-05 NOTE — Progress Notes (Signed)
Mitchell Molina of [facility] called to state he has been denied due to multiple medical problems.  [facility] [name] Richardo [facility] ED CSW [phone]

## 2014-06-05 NOTE — ED Notes (Signed)
BRYN MARR CALLED - DECLINED PT D/T [facility]

## 2014-06-05 NOTE — ED Provider Notes (Signed)
9:00 AM I was called by nursing regarding patient's cough and left ankle swelling.  He has some mild wheezing, but is breathing comfortably.  He reports a past use of inhalers.  Will put one in PRN.  He also has mild swelling to his left ankle, possibly from his fall.  Will check plain films.   Plain films negative.   Houston Siren III, MD 06/05/14 [phone]

## 2014-06-06 ENCOUNTER — Encounter (HOSPITAL_COMMUNITY): Payer: Self-pay | Admitting: Emergency Medicine

## 2014-06-06 ENCOUNTER — Emergency Department (HOSPITAL_COMMUNITY)
Admission: EM | Admit: 2014-06-06 | Discharge: 2014-06-06 | Disposition: A | Discharge status: Home / Self Care | Payer: Medicare Other | Source: Home / Self Care | Attending: Emergency Medicine | Admitting: Emergency Medicine

## 2014-06-06 DIAGNOSIS — Z9861 Coronary angioplasty status: Secondary | ICD-10-CM | POA: Insufficient documentation

## 2014-06-06 DIAGNOSIS — Z590 Homelessness: Secondary | ICD-10-CM

## 2014-06-06 DIAGNOSIS — Z8719 Personal history of other diseases of the digestive system: Secondary | ICD-10-CM | POA: Insufficient documentation

## 2014-06-06 DIAGNOSIS — Z87891 Personal history of nicotine dependence: Secondary | ICD-10-CM

## 2014-06-06 DIAGNOSIS — I252 Old myocardial infarction: Secondary | ICD-10-CM

## 2014-06-06 DIAGNOSIS — F10120 Alcohol abuse with intoxication, uncomplicated: Secondary | ICD-10-CM | POA: Insufficient documentation

## 2014-06-06 DIAGNOSIS — R45851 Suicidal ideations: Secondary | ICD-10-CM | POA: Diagnosis not present

## 2014-06-06 DIAGNOSIS — Z8669 Personal history of other diseases of the nervous system and sense organs: Secondary | ICD-10-CM

## 2014-06-06 DIAGNOSIS — F10920 Alcohol use, unspecified with intoxication, uncomplicated: Secondary | ICD-10-CM

## 2014-06-06 DIAGNOSIS — F329 Major depressive disorder, single episode, unspecified: Secondary | ICD-10-CM

## 2014-06-06 MED ORDER — FLUOXETINE HCL 20 MG PO CAPS
20.0000 mg | ORAL_CAPSULE | Freq: Every day | ORAL | Status: DC
  Filled 2014-06-06: qty 1

## 2014-06-06 MED ORDER — MELOXICAM 7.5 MG PO TABS
7.5000 mg | ORAL_TABLET | Freq: Every day | ORAL | Status: DC
  Administered 2014-06-06: 7.5 mg via ORAL
  Filled 2014-06-06 (×2): qty 1

## 2014-06-06 MED ORDER — AMLODIPINE BESYLATE 5 MG PO TABS
5.0000 mg | ORAL_TABLET | Freq: Every day | ORAL | Status: DC
  Administered 2014-06-06: 5 mg via ORAL
  Filled 2014-06-06: qty 1

## 2014-06-06 MED ORDER — ADULT MULTIVITAMIN W/MINERALS CH
1.0000 | ORAL_TABLET | Freq: Every day | ORAL | Status: DC
  Administered 2014-06-06: 1 via ORAL
  Filled 2014-06-06: qty 1

## 2014-06-06 MED ORDER — LACTULOSE 10 GM/15ML PO SOLN
10.0000 g | Freq: Every day | ORAL | Status: DC | PRN
  Filled 2014-06-06: qty 15

## 2014-06-06 MED ORDER — BUDESONIDE-FORMOTEROL FUMARATE 160-4.5 MCG/ACT IN AERO
2.0000 | INHALATION_SPRAY | Freq: Two times a day (BID) | RESPIRATORY_TRACT | Status: DC
  Administered 2014-06-06: 2 via RESPIRATORY_TRACT
  Filled 2014-06-06: qty 6

## 2014-06-06 MED ORDER — IPRATROPIUM-ALBUTEROL 0.5-2.5 (3) MG/3ML IN SOLN: 3.0000 mL | Freq: Four times a day (QID) | RESPIRATORY_TRACT | Status: DC | PRN

## 2014-06-06 MED ORDER — PANTOPRAZOLE SODIUM 40 MG PO TBEC: 40.0000 mg | DELAYED_RELEASE_TABLET | Freq: Every day | ORAL | Status: DC

## 2014-06-06 NOTE — Discharge Instructions (Signed)
If you were given medicines take as directed.  If you are on coumadin or contraceptives realize their levels and effectiveness is altered by many different medicines.  If you have any reaction (rash, tongues swelling, other) to the medicines stop taking and see a physician.   Please follow up as directed and return to the ER or see a physician for new or worsening symptoms.  Thank you. Filed Vitals:   06/06/14 0046 06/06/14 0113 06/06/14 0529 06/06/14 1100  BP: 135/79 127/74 107/63 116/60  Pulse: 69 58 55 66  Temp:  98.2 F (36.8 C) 98.9 F (37.2 C) 98.9 F (37.2 C)  TempSrc:  Oral Oral Oral  Resp:  18 18 20   Weight:      SpO2:  94% 100% 94%

## 2014-06-06 NOTE — ED Notes (Signed)
Spoke to [employer] about pt telepsych ordered due to pt advising he is no longer suicidal or homicidal
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
## 2014-06-06 NOTE — Discharge Instructions (Signed)
Alcohol Intoxication °Alcohol intoxication occurs when you drink enough alcohol that it affects your ability to function. It can be mild or very severe. Drinking a lot of alcohol in a short time is called binge drinking. This can be very harmful. Drinking alcohol can also be more dangerous if you are taking medicines or other drugs. Some of the effects caused by alcohol may include: °· Loss of coordination. °· Changes in mood and behavior. °· Unclear thinking. °· Trouble talking (slurred speech). °· Throwing up (vomiting). °· Confusion. °· Slowed breathing. °· Twitching and shaking (seizures). °· Loss of consciousness. °HOME CARE °· Do not drive after drinking alcohol. °· Drink enough water and fluids to keep your pee (urine) clear or pale yellow. Avoid caffeine. °· Only take medicine as told by your doctor. °GET HELP IF: °· You throw up (vomit) many times. °· You do not feel better after a few days. °· You frequently have alcohol intoxication. Your doctor can help decide if you should see a substance use treatment counselor. °GET HELP RIGHT AWAY IF: °· You become shaky when you stop drinking. °· You have twitching and shaking. °· You throw up blood. It may look bright red or like coffee grounds. °· You notice blood in your poop (bowel movements). °· You become lightheaded or pass out (faint). °MAKE SURE YOU:  °· Understand these instructions. °· Will watch your condition. °· Will get help right away if you are not doing well or get worse. °Document Released: 09/25/2007 Document Revised: 12/09/2012 Document Reviewed: 09/11/2012 °ExitCare® Patient Information ©2015 [employer]. This information is not intended to replace advice given to you by your health care provider. Make sure you discuss any questions you have with your health care provider. ° °

## 2014-06-06 NOTE — ED Notes (Signed)
Pt ambulating and requesting to leave - Dr. Tawnya Crook made aware.

## 2014-06-06 NOTE — ED Notes (Signed)
Bed: WA18 Expected date:  Expected time:  Means of arrival:  Comments: EMS 
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
## 2014-06-06 NOTE — ED Notes (Signed)
MD Docherty at bedside. 

## 2014-06-06 NOTE — ED Notes (Addendum)
CALLED TINA AT [facility] AND MADE HER AWARE THAT PT HAS CHANGED HIS MIND AND IS NOW SI, SHE WILL INFORM [facility]RAD NP
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
## 2014-06-06 NOTE — ED Notes (Signed)
Pt yelling and cursing at staff.  

## 2014-06-06 NOTE — ED Notes (Signed)
PT TALKING TO [facility] NP AGAIN

## 2014-06-06 NOTE — ED Notes (Signed)
Pt ambulating independently to restroom, no acute distress, speaking complete sentences.
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
## 2014-06-06 NOTE — ED Notes (Signed)
ALL VALUABLES AND BELONGINGS RETURNED TO PATIENT. BUS PASS GIVEN

## 2014-06-06 NOTE — ED Notes (Addendum)
Pt refusing to change into hospital gown as well has being hooked up to the cardiac monitor. Pt A&Ox4.
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
## 2014-06-06 NOTE — ED Notes (Signed)
Pt is stable. NAD at this time. Pt is taking the bus home.

## 2014-06-06 NOTE — ED Notes (Signed)
Pt refusing discharge vital signs

## 2014-06-06 NOTE — ED Notes (Signed)
Pt allowed this RN to place him on cardiac monitor.

## 2014-06-06 NOTE — ED Notes (Signed)
Upon assessment when pt asked how he was feeling the only response pt will give is "NO" - even after repeated questioning. Pt remains lying on stretcher w/ eyes closed. No acute distress noted.

## 2014-06-07 ENCOUNTER — Encounter (HOSPITAL_COMMUNITY): Payer: Self-pay | Admitting: Cardiology

## 2014-06-13 NOTE — Progress Notes (Addendum)
Late entry for G-code addition (unclear why pt needs G-codes). Based on review of the evaluation and goals set on 05/29/14.     05-29-14 1422  PT G-Codes **NOT FOR INPATIENT CLASS**  Functional Assessment Tool Used Clinical judgement  Functional Limitation Mobility: Walking and moving around  Mobility: Walking and Moving Around Current Status [phone]) CI  Mobility: Walking and Moving Around Goal Status [phone]) CI  Lavonia Dana, [place]  [phone] 06/13/2014
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
## 2014-10-19 ENCOUNTER — Encounter: Payer: Self-pay | Admitting: Urology

## 2014-10-19 ENCOUNTER — Ambulatory Visit (INDEPENDENT_AMBULATORY_CARE_PROVIDER_SITE_OTHER): Admitting: Urology

## 2014-10-19 VITALS — BP 127/75 | HR 75 | Ht 68.0 in | Wt 211.1 lb

## 2014-10-19 DIAGNOSIS — Z8546 Personal history of malignant neoplasm of prostate: Secondary | ICD-10-CM

## 2014-10-19 DIAGNOSIS — R339 Retention of urine, unspecified: Secondary | ICD-10-CM

## 2014-10-19 LAB — BLADDER SCAN AMB NON-IMAGING: Scan Result: 105

## 2014-10-19 MED ORDER — LIDOCAINE HCL 2 % EX GEL
1.0000 "application " | Freq: Once | CUTANEOUS | Status: AC
  Administered 2014-10-19: 1 via URETHRAL

## 2014-10-19 NOTE — Progress Notes (Signed)
10/19/2014 9:01 AM   Mitchell Molina 1940/10/17 [id number]  Referring provider: No referring provider defined for this encounter.  Chief Complaint  Patient presents with  . Bladder emtying issues    Patient has Mitchell Molina seen at a [facility]    HPI: Mitchell Molina is a 74 year old white male who had an episode of acute urinary retention and had an indwelling foley placed at [facility] on 09/23/2014.  He had the return of 1L of urine.    Prior to the episode of retention, he was experiencing dribbling and straining to urinate.  This was occuring for about one month before he went into retention.  He denies any gross hematuria, fevers, chills, nausea or vomiting.    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  He did not follow through with the treatment.  We have a PSA from 05/16/2014 was 5.48 ng/mL.  He was placed tamsulosin years ago and finasteride recently.  He has an enlarged nodular prostate.     PMH: Past Medical History  Diagnosis Date  . COPD (chronic obstructive pulmonary disease)   . Diabetes mellitus without complication   . Hypertension   . Coronary artery disease   . Arthritis   . Asthma   . Cancer   . CHF (congestive heart failure)   . Stroke   . Renal disorder   . Cirrhosis   . Ascites   . Seizures   . MI (myocardial infarction)     Surgical History: Past Surgical History  Procedure Laterality Date  . Hernia repair    . Hemorrhoid surgery    . Coronary stent placement      Home Medications:    Medication List       This list is accurate as of: 10/19/14  9:01 AM.  Always use your most recent med list.               albuterol-ipratropium 18-103 MCG/ACT inhaler  Commonly known as:  COMBIVENT  Inhale 1-2 puffs into the lungs every 6 (six) hours as needed for wheezing or shortness of breath (Use as rescue inhaler).     amLODipine 5 MG  tablet  Commonly known as:  NORVASC  Take 1 tablet (5 mg total) by mouth daily.     budesonide-formoterol 160-4.5 MCG/ACT inhaler  Commonly known as:  SYMBICORT  Inhale 2 puffs into the lungs 2 (two) times daily.     diclofenac sodium 1 % Gel  Commonly known as:  VOLTAREN  Apply 2 g topically 4 (four) times daily.     FLUoxetine 20 MG capsule  Commonly known as:  PROZAC  Take 1 capsule (20 mg total) by mouth daily.     folic acid 1 MG tablet  Commonly known as:  FOLVITE  Take 1 tablet (1 mg total) by mouth daily.     lactulose 10 GM/15ML solution  Commonly known as:  CHRONULAC  Take 15 mLs (10 g total) by mouth daily as needed for mild constipation.     meloxicam 7.5 MG tablet  Commonly known as:  MOBIC  Take 1 tablet (7.5 mg total) by mouth daily with breakfast.     multivitamin with minerals Tabs tablet  Take 1 tablet by mouth daily.     pantoprazole 40 MG tablet  Commonly known as:  PROTONIX  Take 1 tablet (40 mg total)  by mouth 2 (two) times daily before a meal.     traZODone 100 MG tablet  Commonly known as:  DESYREL  Take 1 tablet (100 mg total) by mouth at bedtime as needed for sleep.        Allergies:  Allergies  Allergen Reactions  . Acetaminophen Other (See Comments)    Cannot take due to liver issues  . Asa [Aspirin] Other (See Comments)    Heartburn  . Aspirin Other (See Comments)    heartburn  . Tylenol [Acetaminophen] Other (See Comments)    Can not take due to Liver issues    Family History: Family History  Problem Relation Age of Onset  . CVA Father   . Colon cancer Mother     Social History:  reports that he has quit smoking. He does not have any smokeless tobacco history on file. He reports that he drinks alcohol. He reports that he does not use illicit drugs.  ROS: Urological Symptom Review  Patient is experiencing the following symptoms: Have to strain to urinate   Review of Systems  Gastrointestinal (upper)  : Negative for  upper GI symptoms  Gastrointestinal (lower) : Negative for lower GI symptoms  Constitutional : Weight loss  Skin: Itching  Eyes: Negative for eye symptoms  Ear/Nose/Throat : Sinus problems  Hematologic/Lymphatic: Easy bruising  Cardiovascular : Negative for cardiovascular symptoms  Respiratory : Cough Shortness of breath  Endocrine: Excessive thirst  Musculoskeletal: Back pain  Neurological: Headaches  Psychologic: Depression Anxiety   Physical Exam: There were no vitals taken for this visit.  Constitutional:  Alert and oriented, No acute distress. HEENT: [place] AT, moist mucus membranes.  Trachea midline, no masses. Cardiovascular: No clubbing, cyanosis, or edema. Respiratory: Normal respiratory effort, no increased work of breathing. GI: Abdomen is soft, nontender, nondistended, no abdominal masses GU: No CVA tenderness. GU: Patient with uncircumcised phallus. Foreskin easily retracted.  Urethral meatus is patent.  No penile discharge. No penile lesions or rashes. Scrotum without lesions, cysts, rashes and/or edema.  Testicles are located scrotally bilaterally. No masses are appreciated in the testicles. Left and right epididymis are normal. Rectal: Patient with  normal sphincter tone. Perineum without scarring or rashes. No rectal masses are appreciated. Prostate is approximately 80 grams,  nodular. Seminal vesicles are normal. Skin: No rashes, bruises or suspicious lesions. Lymph: No cervical or inguinal adenopathy. Neurologic: Grossly intact, no focal deficits, moving all 4 extremities. Psychiatric: Normal mood and affect.  Laboratory Data: Lab Results  Component Value Date   WBC 4.4 06/04/2014   HGB 13.3 06/04/2014   HCT 39.0 06/04/2014   MCV 82.3 06/04/2014   PLT 205 06/04/2014    Lab Results  Component Value Date   CREATININE 0.80 06/04/2014    Lab Results  Component Value Date   PSA 5.48* 05/16/2014    No results found for:  TESTOSTERONE  Lab Results  Component Value Date   HGBA1C 6.4* 05/14/2014    Urinalysis    Component Value Date/Time   COLORURINE YELLOW 06/04/2014 1704   APPEARANCEUR CLEAR 06/04/2014 1704   LABSPEC 1.007 06/04/2014 1704   PHURINE 5.0 06/04/2014 1704   GLUCOSEU NEGATIVE 06/04/2014 1704   HGBUR NEGATIVE 06/04/2014 1704   BILIRUBINUR NEGATIVE 06/04/2014 1704   KETONESUR NEGATIVE 06/04/2014 1704   PROTEINUR NEGATIVE 06/04/2014 1704   UROBILINOGEN 1.0 06/04/2014 1704   NITRITE NEGATIVE 06/04/2014 1704   LEUKOCYTESUR NEGATIVE 06/04/2014 1704    Pertinent Imaging:   Assessment & Plan:  1. Acute urinary retention:  With patient's possible h/o PCa and enlarged prostate, it is doubtful he will be able to urinate on his own without some sort of treatment of the PCa to reduce the size of the prostate.  He is currently on tamsulosin and finasteride.  The finasteride was started recently.  We exchanged his foley catheter today.   2. Patient reported history of PCa:  Patient states he has been seen in [place] by an urologist for his PCa.  He cannot recall the name of the practice or the name of the physician.  He was at [facility] and his social worker's last name was Barefoot.  We will try to obtain those records.  We obtained a PSA today.  He will follow up when we received the records and his PSA has returned.    There are no diagnoses linked to this encounter.  No Follow-up on file.  Zara Council, [facility] [address], [place], [place] [address] [phone]

## 2014-10-19 NOTE — Progress Notes (Signed)
Cath Change/ Replacement  Patient is present today for a catheter change due to urinary retention.  76ml of water was removed from the balloon, a 16FR foley cath was removed with out difficulty.  Patient was cleaned and prepped in a sterile fashion with betadine and 2% lidocaine jelly was instilled into the urethra. A 16 FR coude foley cath was replaced into the bladder no complications were noted Urine return was noted 53ml and urine was yellow in color. The balloon was filled with 109ml of sterile water. Patient refused leg bag. Patient could not use our overnight bag due to metal clamp on it and patient being incarcerated. Patient demanded to use the bag that he had on the previous catheter. Patient was given proper instruction on catheter care.    Preformed by: Lyndee Hensen CMA

## 2014-10-20 LAB — PSA: PROSTATE SPECIFIC AG, SERUM: 214.3 ng/mL — AB (ref 0.0–4.0)

## 2014-10-25 ENCOUNTER — Other Ambulatory Visit: Payer: Self-pay | Admitting: Urology

## 2014-10-25 ENCOUNTER — Telehealth: Payer: Self-pay

## 2014-10-25 DIAGNOSIS — C61 Malignant neoplasm of prostate: Secondary | ICD-10-CM

## 2014-10-25 NOTE — Telephone Encounter (Signed)
-----   Message from Nori Riis, PA-C sent at 10/25/2014  1:34 PM EDT ----- We will need to schedule a CT scan w/wo of abdomen and pelvis and bone scan on this patient.

## 2014-10-25 NOTE — Telephone Encounter (Signed)
Can you schedule this, please

## 2014-10-28 NOTE — Telephone Encounter (Signed)
Orders have been placed and are in the referral workque.

## 2015-04-16 IMAGING — CR DG ANKLE COMPLETE 3+V*L*
3 series · 3 of 3 positions shown · non-contrast
Comparison: None.

CLINICAL DATA: Left ankle pain and swelling for 2 weeks. Unknown
injury. Initial encounter.

EXAM:
LEFT ANKLE COMPLETE - 3+ VIEW

[ankle ap]
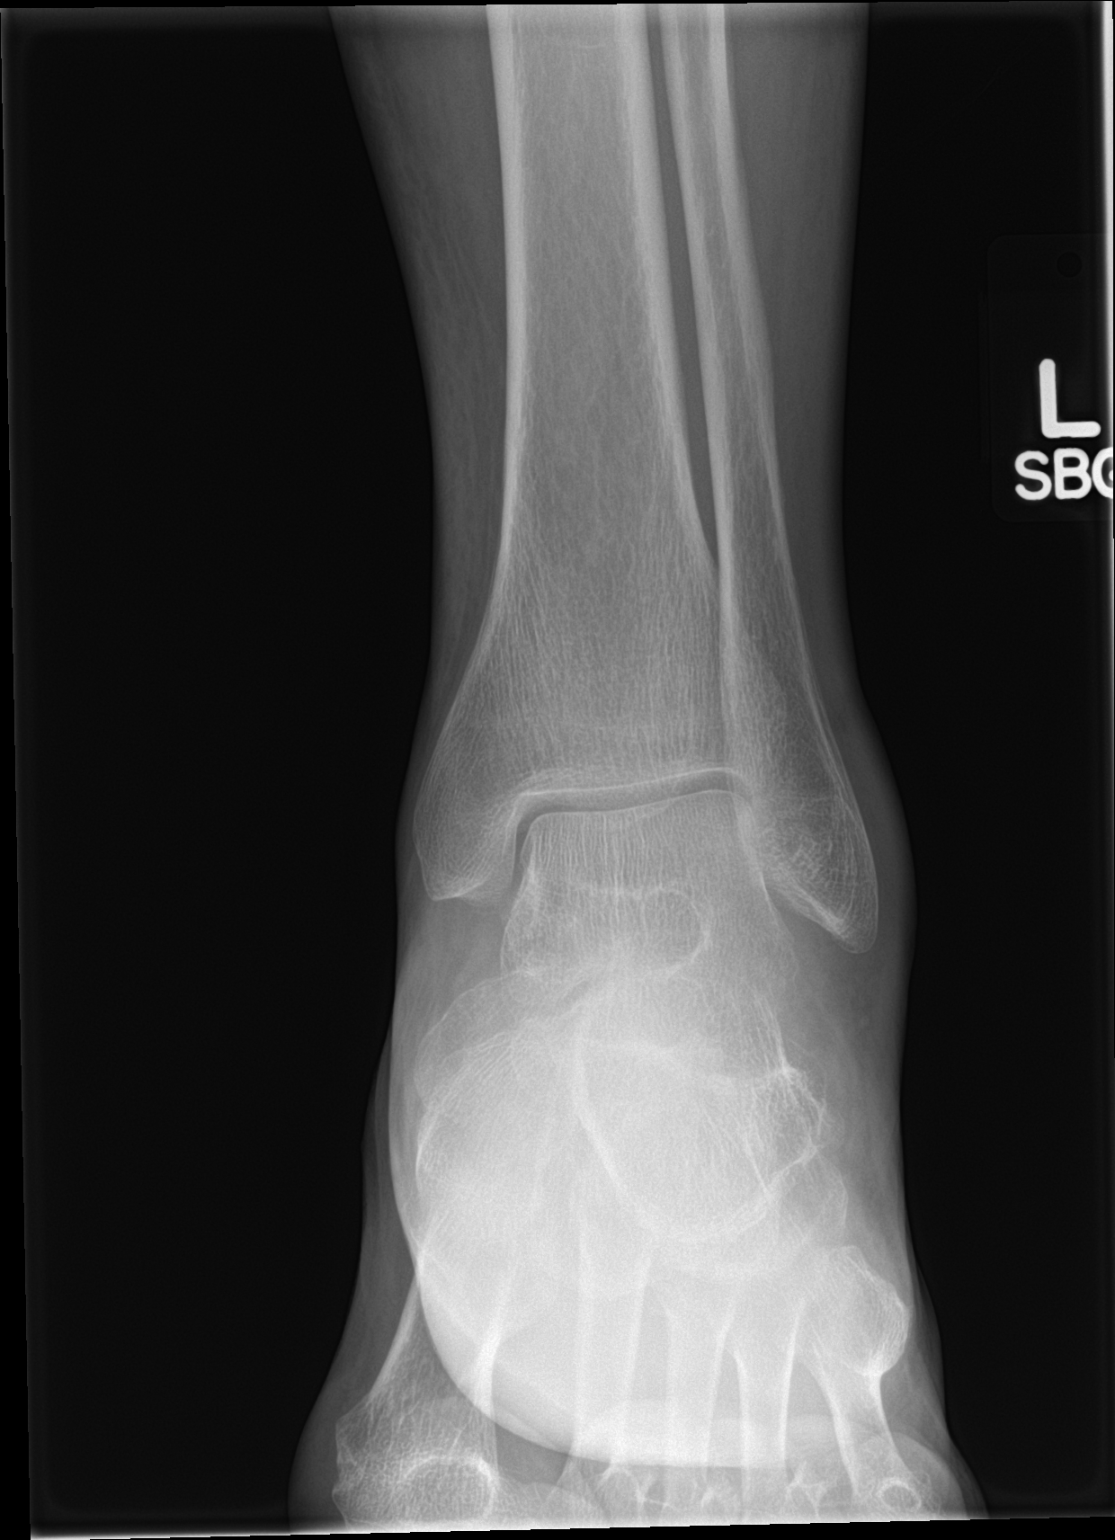

[ankle obl]
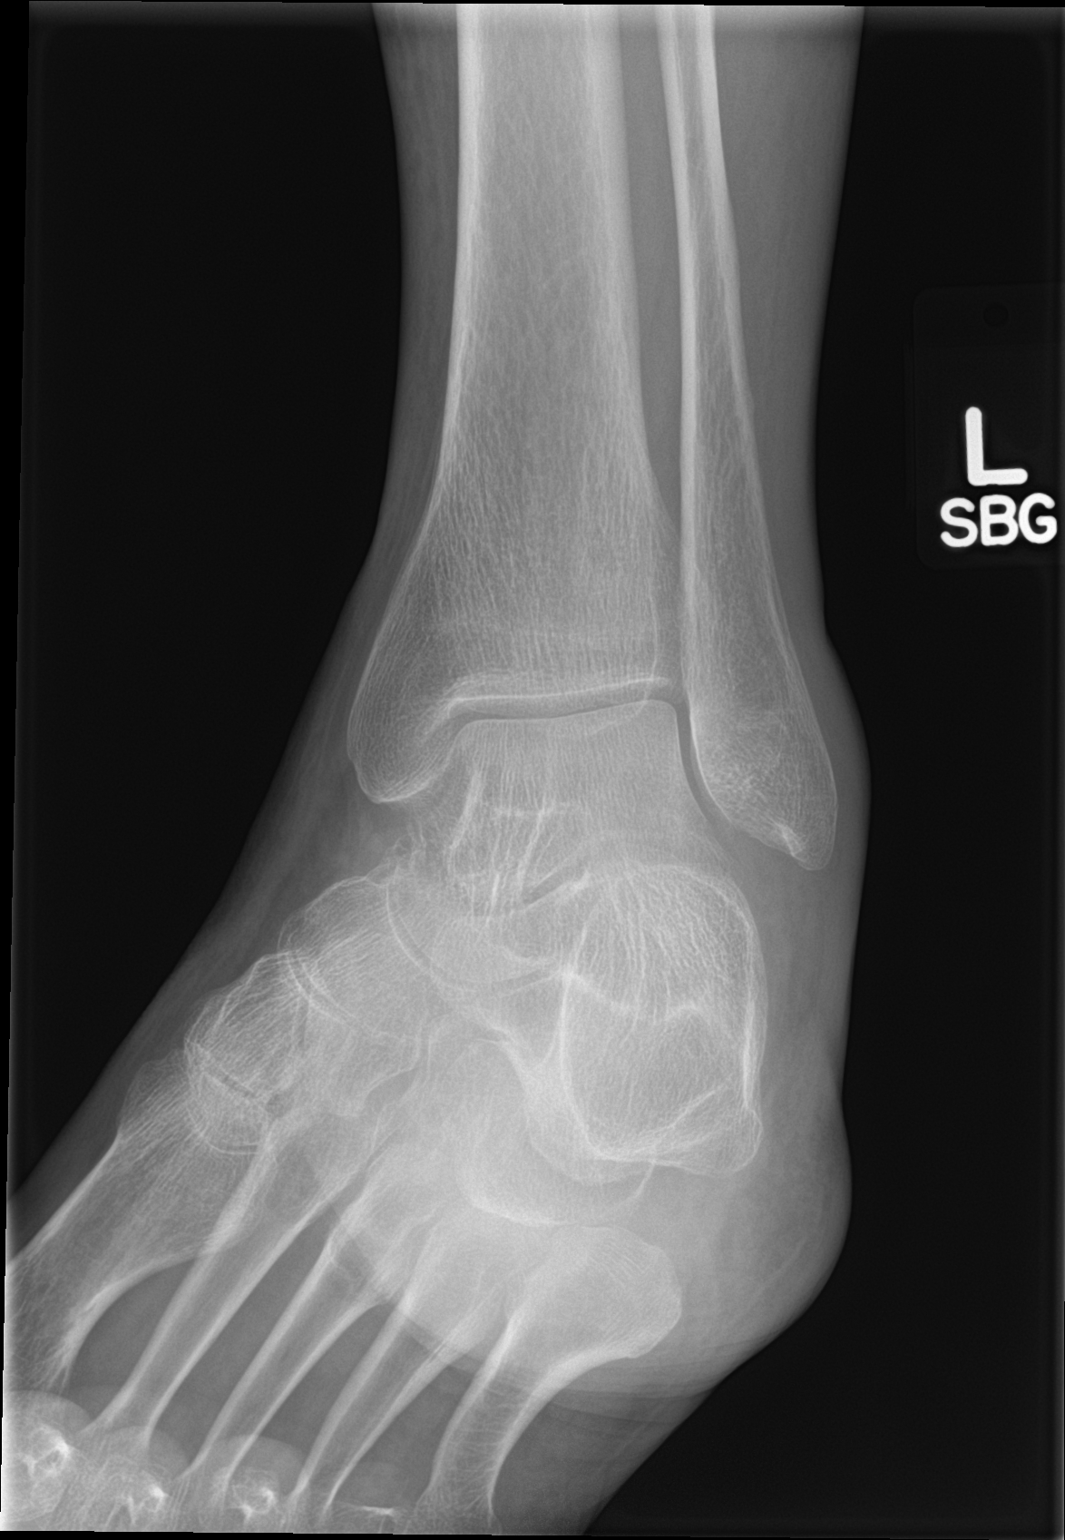

[ankle lat]
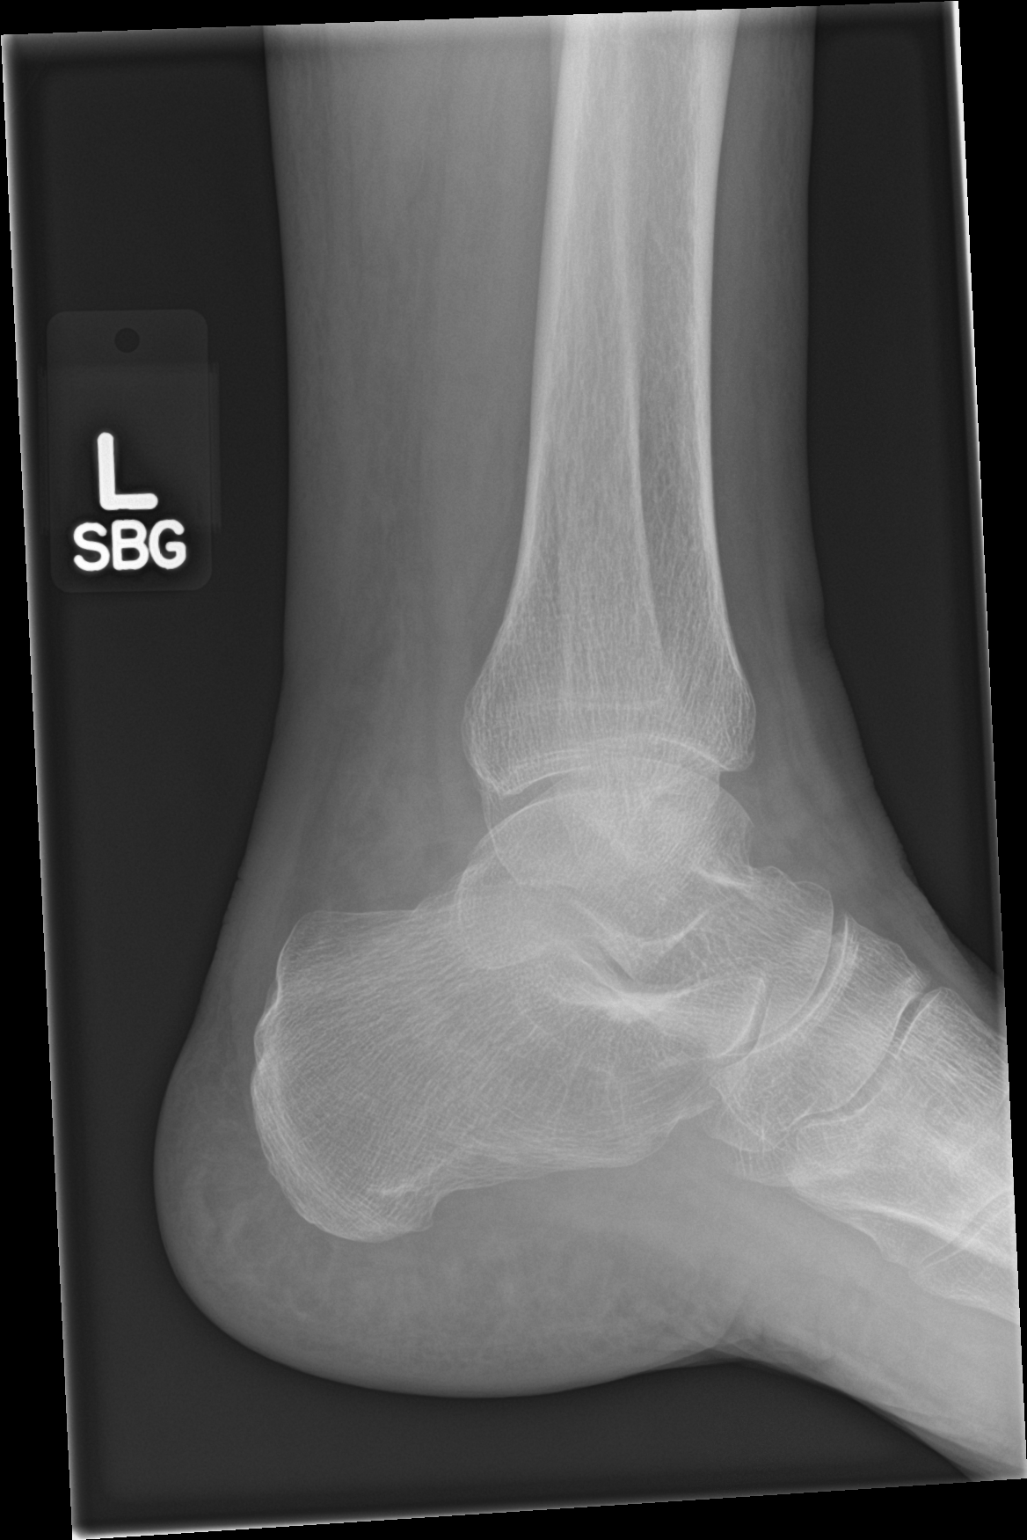

[3 of 3 positions shown; findings below may reference images not displayed]

FINDINGS: There is no evidence of fracture, dislocation, or joint effusion.
There is no evidence of arthropathy or other focal bone abnormality.
Soft tissues are unremarkable.
IMPRESSION: Negative.

## 2016-07-18 ENCOUNTER — Telehealth: Payer: Self-pay | Admitting: Urology

## 2016-07-18 NOTE — Telephone Encounter (Signed)
Opened in error

## 2016-07-21 DEATH — deceased
